# Patient Record
Sex: Male | Born: 1990 | Race: Black or African American | Hispanic: No | Marital: Married | Smoking: Never smoker
Health system: Southern US, Community
[De-identification: ages and names within clinical notes are randomized; demographics above are authoritative.]

## PROBLEM LIST (undated history)

## (undated) DIAGNOSIS — G43909 Migraine, unspecified, not intractable, without status migrainosus: Secondary | ICD-10-CM

## (undated) DIAGNOSIS — Z8619 Personal history of other infectious and parasitic diseases: Secondary | ICD-10-CM

## (undated) HISTORY — DX: Personal history of other infectious and parasitic diseases: Z86.19

## (undated) HISTORY — DX: Migraine, unspecified, not intractable, without status migrainosus: G43.909

---

## 2016-09-29 DIAGNOSIS — Z0000 Encounter for general adult medical examination without abnormal findings: Secondary | ICD-10-CM | POA: Diagnosis not present

## 2016-09-29 DIAGNOSIS — Z118 Encounter for screening for other infectious and parasitic diseases: Secondary | ICD-10-CM | POA: Diagnosis not present

## 2016-09-29 DIAGNOSIS — Z114 Encounter for screening for human immunodeficiency virus [HIV]: Secondary | ICD-10-CM | POA: Diagnosis not present

## 2016-09-29 DIAGNOSIS — F320 Major depressive disorder, single episode, mild: Secondary | ICD-10-CM | POA: Diagnosis not present

## 2016-10-07 DIAGNOSIS — Z0100 Encounter for examination of eyes and vision without abnormal findings: Secondary | ICD-10-CM | POA: Diagnosis not present

## 2017-03-25 DIAGNOSIS — J019 Acute sinusitis, unspecified: Secondary | ICD-10-CM | POA: Diagnosis not present

## 2017-04-11 DIAGNOSIS — J069 Acute upper respiratory infection, unspecified: Secondary | ICD-10-CM | POA: Diagnosis not present

## 2017-04-11 DIAGNOSIS — R509 Fever, unspecified: Secondary | ICD-10-CM | POA: Diagnosis not present

## 2017-04-16 ENCOUNTER — Encounter: Payer: Self-pay | Admitting: Physician Assistant

## 2017-04-16 ENCOUNTER — Emergency Department (HOSPITAL_COMMUNITY)
Admission: EM | Admit: 2017-04-16 | Discharge: 2017-04-16 | Disposition: A | Discharge status: Home / Self Care | Payer: BLUE CROSS/BLUE SHIELD | Attending: Emergency Medicine | Admitting: Emergency Medicine

## 2017-04-16 ENCOUNTER — Emergency Department (HOSPITAL_COMMUNITY): Payer: BLUE CROSS/BLUE SHIELD

## 2017-04-16 ENCOUNTER — Encounter (HOSPITAL_COMMUNITY): Payer: Self-pay | Admitting: Emergency Medicine

## 2017-04-16 ENCOUNTER — Ambulatory Visit (INDEPENDENT_AMBULATORY_CARE_PROVIDER_SITE_OTHER): Payer: BLUE CROSS/BLUE SHIELD | Admitting: Physician Assistant

## 2017-04-16 VITALS — BP 130/80 | HR 60 | Temp 97.9°F | Ht 67.0 in | Wt 215.5 lb

## 2017-04-16 DIAGNOSIS — R112 Nausea with vomiting, unspecified: Secondary | ICD-10-CM

## 2017-04-16 DIAGNOSIS — R1031 Right lower quadrant pain: Secondary | ICD-10-CM

## 2017-04-16 DIAGNOSIS — R509 Fever, unspecified: Secondary | ICD-10-CM

## 2017-04-16 DIAGNOSIS — R109 Unspecified abdominal pain: Secondary | ICD-10-CM | POA: Diagnosis not present

## 2017-04-16 LAB — COMPREHENSIVE METABOLIC PANEL
ALT: 20 U/L (ref 17–63)
AST: 22 U/L (ref 15–41)
Albumin: 4.4 g/dL (ref 3.5–5.0)
Alkaline Phosphatase: 58 U/L (ref 38–126)
Anion gap: 5 (ref 5–15)
BUN: 12 mg/dL (ref 6–20)
CO2: 27 mmol/L (ref 22–32)
Calcium: 9.1 mg/dL (ref 8.9–10.3)
Chloride: 107 mmol/L (ref 101–111)
Creatinine, Ser: 1.16 mg/dL (ref 0.61–1.24)
GFR calc Af Amer: >60 mL/min (ref >60)
GFR calc non Af Amer: >60 mL/min (ref >60)
Glucose, Bld: 100 mg/dL — ABNORMAL HIGH (ref 65–99)
Potassium: 4.1 mmol/L (ref 3.5–5.1)
Sodium: 139 mmol/L (ref 135–145)
Total Bilirubin: 0.8 mg/dL (ref 0.3–1.2)
Total Protein: 8.2 g/dL — ABNORMAL HIGH (ref 6.5–8.1)

## 2017-04-16 LAB — ABO/RH: ABO/RH(D): B POS

## 2017-04-16 LAB — CBC
HCT: 45.4 % (ref 39.0–52.0)
Hemoglobin: 15.4 g/dL (ref 13.0–17.0)
MCH: 29.1 pg (ref 26.0–34.0)
MCHC: 33.9 g/dL (ref 30.0–36.0)
MCV: 85.8 fL (ref 78.0–100.0)
Platelets: 199 10*3/uL (ref 150–400)
RBC: 5.29 MIL/uL (ref 4.22–5.81)
RDW: 13.1 % (ref 11.5–15.5)
WBC: 4.4 10*3/uL (ref 4.0–10.5)

## 2017-04-16 LAB — TYPE AND SCREEN
ABO/RH(D): B POS
Antibody Screen: NEGATIVE

## 2017-04-16 MED ORDER — IOPAMIDOL (ISOVUE-300) INJECTION 61%
100.0000 mL | Freq: Once | INTRAVENOUS | Status: AC | PRN
  Administered 2017-04-16: 100 mL via INTRAVENOUS

## 2017-04-16 NOTE — Progress Notes (Signed)
Ransom Nickson is a 26 y.o. male here for to [place], Dx with Flu over the weekend at Urgent Care. Still having Abd pain and nausea.  I acted as a Education administrator for [employer], PA-C Anselmo Pickler, LPN  History of Present Illness:   Chief Complaint  Patient presents with  . Nausea    Dx with Flu on Sat at [facility]  . Abdominal Pain  . 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 The first time, he presented with fever and sore throat, he thinks that time he was prescribed an antibiotic, but he is not sure. The other two times he presented with abdominal pain, nausea and fevers, and was prescribed Tamiflu both times. He states that was never tested for the flu or had any imaging or blood work done. His fevers have been as high as 103. He has had nausea and abdominal pain consistently all week. He finished his most recent Tamiflu course yesterday. Yesterday and today was the first time that he had any appetite and he ate some vegetables and rice. He has lost about 5 lb this month from being ill. Denies diarrhea. Does have occasional hard stools and at least weekly has blood on the toilet paper when he wipes. Denies blood in emesis. 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 Denies drugs or any alcohol intake. Last physical was around August 2017, states that he had some routine lab work done, but it is not available for me to review presently. Sexually active with women, monogamous. Denies any issues with urination, penile pain, lesions or discharge. Denies sick contacts or knowledge of anyone in his contacts with similar symptoms. At the beginning of the month, when he was having  sore throat he was taking OTC Emergen-C, Theraflu; has not taken any other OTC medications. States that he has been drinking water. States that his abdominal pain has been relatively constant since Saturday, has no relationship to food intake. Nothing makes the pain better or worse. He also reports that he has had significant fatigue.   Weight -- Weight: 215 lb 8 oz (97.8 kg)   Depression screen PHQ 2/9 04/16/2017  Decreased Interest 0  Down, Depressed, Hopeless 0  PHQ - 2 Score 0   PMHx, SurgHx, SocialHx, Medications, and Allergies were reviewed in the Visit Navigator and updated as appropriate.  Current Medications:  No current outpatient prescriptions on file.   Review of Systems:   Review of Systems  Constitutional: Positive for chills, fever and malaise/fatigue.  HENT: Positive for tinnitus.   Eyes: Negative.   Respiratory: Negative.   Cardiovascular: Negative.   Gastrointestinal: Positive for abdominal pain and nausea.  Genitourinary: Negative.   Musculoskeletal: Negative.   Skin: Negative.   Neurological: Positive for headaches.  Endo/Heme/Allergies: Bruises/bleeds easily.  Psychiatric/Behavioral: Negative.     Vitals:   Vitals:   04/16/17 1554  BP: 130/80  Pulse: 60  Temp: 97.9 F (36.6 C)  TempSrc: Oral  SpO2: 97%  Weight: 215 lb 8 oz (97.8 kg)  Height: 5\' 7"  (1.702 m)     Body mass index is 33.75 kg/m.  Physical Exam:  Physical Exam  Constitutional: He appears well-developed. He is cooperative.  Non-toxic appearance. He does not have a sickly appearance. He does not appear ill. No distress.  Cardiovascular: Normal rate, regular rhythm, S1 normal, S2 normal, normal heart sounds and normal pulses.   No LE edema  Pulmonary/Chest: Effort normal and breath sounds normal.  Abdominal: Normal appearance and bowel sounds are normal. There is tenderness in the right lower quadrant. There is guarding and tenderness at McBurney's point. There is no rigidity, no  rebound, no CVA tenderness and negative Murphy's sign.  Genitourinary: Rectal exam shows guaiac positive stool. Rectal exam shows no external hemorrhoid, no internal hemorrhoid, no fissure, no tenderness and anal tone normal.  Neurological: He is alert.  Nursing note and vitals reviewed.     Assessment and Plan:    Ed was seen today for nausea, abdominal pain and fatigue.  Diagnoses and all orders for this visit:  Right lower quadrant abdominal pain  Nausea and vomiting, intractability of vomiting not specified, unspecified vomiting type  Fever and chills   Given physical exam findings (RLQ pain, + McBurney's point, +guaic stool) and history, will send patient to ER for stat labs and work-up, especially to r/o appendicitis. I discussed with patient risks and benefits of going to ER vs in-house testing. He is agreeable to going to the ER. I advised patient that we will review his records and follow-up with him as needed. Patient's uncle transported him to office and will take him to the ER. I recommended [employer].  . Reviewed expectations re: course of current medical issues. . Discussed self-management of symptoms. . Outlined signs and symptoms indicating need for more acute intervention. . Patient verbalized understanding and all questions were answered. . See orders for this visit as documented in the electronic medical record. . Patient received an After-Visit Summary.  CMA or LPN served as scribe during this visit. History, Physical, and Plan performed by medical provider. Documentation and orders reviewed and attested to.  Inda Coke, PA-C

## 2017-04-16 NOTE — Patient Instructions (Signed)
Please go to [facility] ER for further evaluation.  Tell them you are having right-lower quadrant pain, fevers, and your stool was heme occult positive at the doctor's office today.  Address: [address], [place], [place] [address]

## 2017-04-16 NOTE — Progress Notes (Signed)
Pre visit review using our clinic review tool, if applicable. No additional management support is needed unless otherwise documented below in the visit note. 

## 2017-04-16 NOTE — ED Provider Notes (Signed)
Charles Brock   CSN: [id number] Arrival date & time: 04/16/17  1652  By signing my name below, I, Charles Brock, attest that this documentation has been prepared under the direction and in the presence of Charles Manifold, MD . Electronically Signed: Dolores Brock, Scribe. 04/16/2017. 7:26 PM.  History   Chief Complaint Chief Complaint  Patient presents with  . Rectal Bleeding   The history is provided by the patient. No language interpreter was used.    HPI Comments:  Charles Brock is a 26 y.o. male with no pertinent pmhx who presents to the Emergency Department complaining of intermittent, mild RLQ abdominal pain onset 5 days ago. He reports associated nausea, dizziness, rash, dark urine, headache and fatigue. Pt describes his pain as typically lasting for 2-3 minutes and during these episodes his nausea is much worse. Pt was seen by [facility] 5 days ago and was dx with the flu. He states that at the time he was experiencing sore throat and fevers but these symptoms have since resolved. Pt was seen earlier today at [facility] and his hemoccult resulted positive. Pt denies any chest pain or increased burping. He has no abdominal shx. No recent travel history. Pt is from [place] and came to the [place] 8 months ago.    Past Medical History:  Diagnosis Date  . History of chicken pox   . Migraines     There are no active problems to display for this patient.   History reviewed. No pertinent surgical history.     Home Medications    Prior to Admission medications   Not on File    Family History History reviewed. No pertinent family history.  Social History Social History  Substance Use Topics  . Smoking status: Never Smoker  . Smokeless tobacco: Never Used  . Alcohol use No     Allergies   Patient has no known allergies.   Review of Systems Review of Systems  Constitutional: Positive for fatigue.  HENT:       Negative for Burping  Cardiovascular: Negative for  chest pain.  Gastrointestinal: Positive for nausea.  Genitourinary:       Positive for Dark Urine  Skin: Positive for rash.  Neurological: Positive for dizziness and headaches.  All other systems reviewed and are negative.    Physical Exam Updated Vital Signs BP (!) 126/93 (BP Location: Left Arm)   Pulse 65   Temp 98.2 F (36.8 C) (Oral)   Resp 20   SpO2 100%   Physical Exam  Constitutional: He is oriented to person, place, and time. He appears well-developed and well-nourished.  HENT:  Head: Normocephalic and atraumatic.  Eyes: EOM are normal.  Neck: Normal range of motion.  Cardiovascular: Normal rate, regular rhythm, normal heart sounds and intact distal pulses.   Pulmonary/Chest: Effort normal and breath sounds normal. No respiratory distress.  Abdominal: Soft. He exhibits no distension. There is no tenderness.  Right sided abdominal tenderness worse in RLQ. Voluntary guarding.   Musculoskeletal: Normal range of motion.  Neurological: He is alert and oriented to person, place, and time.  Skin: Skin is warm and dry.  3 subcutaneous nodules about the size of a dime to right forearm, right lower thoracic back, and one left-mid thigh. Mildly tender. No overlying skin changes.   Psychiatric: He has a normal mood and affect. Judgment normal.  Nursing Brock and vitals reviewed.    ED Treatments / Results  DIAGNOSTIC STUDIES:  Oxygen Saturation is 100% on  RA, normal by my interpretation.    COORDINATION OF CARE:  7:39 PM Discussed treatment plan with pt at bedside which includes blood work and pt agreed to plan.  Labs (all labs ordered are listed, but only abnormal results are displayed) Labs Reviewed  COMPREHENSIVE METABOLIC PANEL - Abnormal; Notable for the following:       Result Value   Glucose, Bld 100 (*)    Total Protein 8.2 (*)    All other components within normal limits  CBC  TYPE AND SCREEN  ABO/RH    EKG  EKG Interpretation None        Radiology No results found.   Ct Abdomen Pelvis W Contrast  Result Date: 04/16/2017 CLINICAL DATA:  Initial evaluation for intermittent mild right lower quadrant abdominal pain. EXAM: CT ABDOMEN AND PELVIS WITH CONTRAST TECHNIQUE: Multidetector CT imaging of the abdomen and pelvis was performed using the standard protocol following bolus administration of intravenous contrast. CONTRAST:  111mL ISOVUE-300 IOPAMIDOL (ISOVUE-300) INJECTION 61% COMPARISON:  None available. FINDINGS: Lower chest: Visualized lung bases are clear. Hepatobiliary: Liver within normal limits. Gallbladder normal. No biliary dilatation. Pancreas: Pancreas within normal limits. Spleen: Spleen within normal limits. Adrenals/Urinary Tract: Adrenal glands are normal. Kidneys equal in size with symmetric enhancement. No nephrolithiasis, hydronephrosis, or focal enhancing renal mass. No hydroureter. Partially distended bladder within normal limits. Stomach/Bowel: Stomach within normal limits. No evidence for bowel obstruction. Appendix well visualized within the right lower quadrant, and is of normal caliber and appearance without associated inflammatory changes to suggest acute appendicitis. No other acute inflammatory changes seen elsewhere about the bowels. Vascular/Lymphatic: Normal intravascular enhancement seen throughout the intra-abdominal aorta and its branch vessels. No adenopathy. Reproductive: Prostate normal. Other: No free air or fluid. Small fat containing paraumbilical hernia noted. No right inguinal hernia. Musculoskeletal: No acute osseus abnormality. No worrisome lytic or blastic osseous lesions. Mild levoscoliosis. IMPRESSION: 1. No CT evidence for acute intra-abdominal or pelvic process. 2. Normal appendix. 3. No other findings to explain patient's symptoms identified. Electronically Signed   By: Charles Brock M.D.   On: 04/16/2017 21:01    Procedures Procedures (including critical care time)  Medications  Ordered in ED Medications - No data to display   Initial Impression / Assessment and Plan / ED Course  I have reviewed the triage vital signs and the nursing notes.  Pertinent labs & imaging results that were available during my care of the patient were reviewed by me and considered in my medical decision making (see chart for details).     25yM with R abdominal pain. No hernia. UA ok. CT w/o acute abnormality.   Final Clinical Impressions(s) / ED Diagnoses   Final diagnoses:  Right lower quadrant abdominal pain    New Prescriptions New Prescriptions   No medications on file    I personally preformed the services scribed in my presence. The recorded information has been reviewed is accurate. Charles Manifold, MD.     Charles Manifold, MD 04/22/17 0930

## 2017-04-16 NOTE — ED Triage Notes (Signed)
Pt reports headache, RLQ abdominal pain, nausea and generalized body aches onset last Saturday, went to [facility], diagnosed with influenza, diagnosed with flu and medicated. Abdominal pain and headache since resolved. Fecal occult positive today at Urgent Care. Intermittent dizziness since Saturday.

## 2017-04-16 NOTE — ED Notes (Signed)
Patient transported to CT 

## 2017-06-15 DIAGNOSIS — H00029 Hordeolum internum unspecified eye, unspecified eyelid: Secondary | ICD-10-CM | POA: Diagnosis not present

## 2017-06-23 DIAGNOSIS — F439 Reaction to severe stress, unspecified: Secondary | ICD-10-CM | POA: Diagnosis not present

## 2017-08-08 IMAGING — CT CT ABD-PELV W/ CM
2 of 4 series · 16 of 46 positions shown, 18 images · IV contrast (ISOVUE)
Comparison: None available.

CLINICAL DATA: Initial evaluation for intermittent mild right lower
quadrant abdominal pain.

EXAM:
CT ABDOMEN AND PELVIS WITH CONTRAST
TECHNIQUE: Multidetector CT imaging of the abdomen and pelvis was performed
using the standard protocol following bolus administration of
intravenous contrast.
CONTRAST:  100mL [id number] IOPAMIDOL ([id number]) INJECTION 61%

[Series 2: abd/pel with · axial · 0.79mm/px · z∈[+1008,+1428]mm · 13 of 96 slices shown, 15 images]
[im 6/96  soft-tissue]
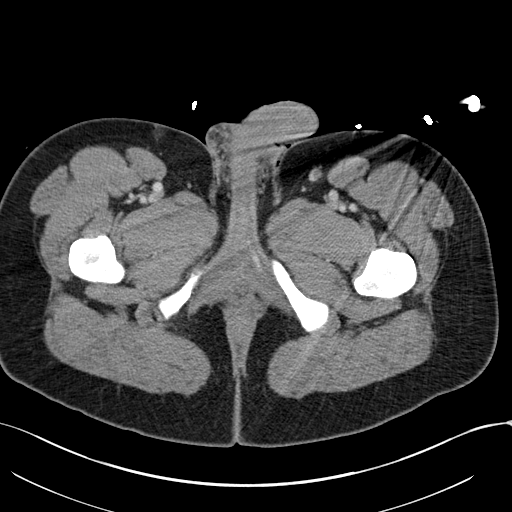
[im 6/96  bone]
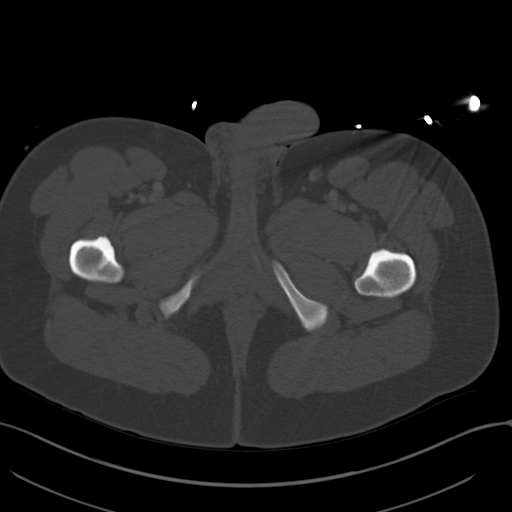
[im 11/96  soft-tissue]
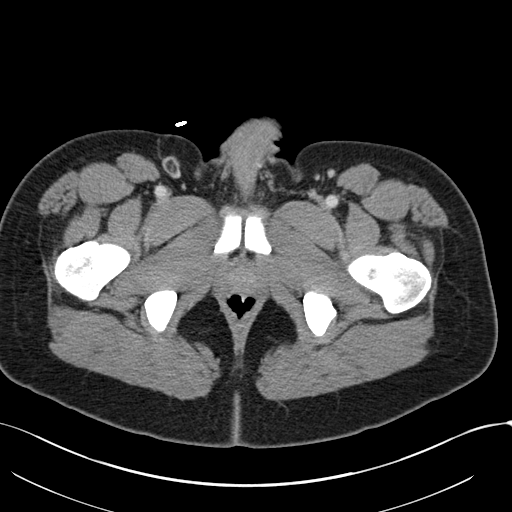
[im 22/96  soft-tissue]
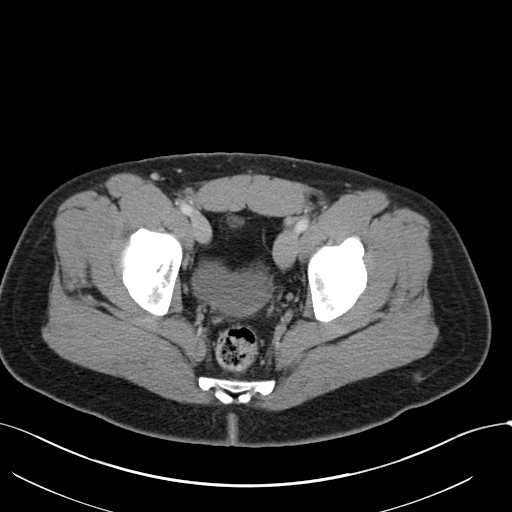
[im 27/96  soft-tissue]
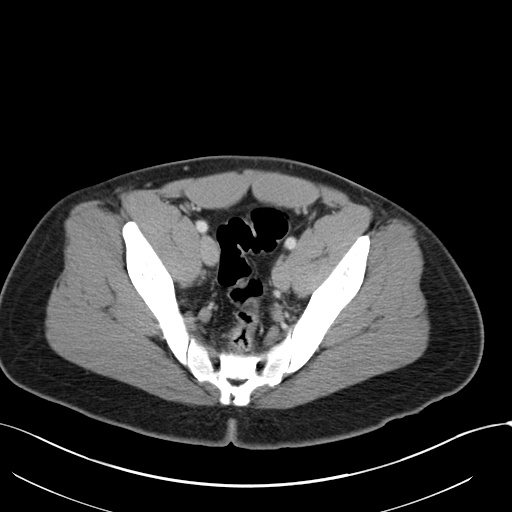
[im 32/96  soft-tissue]
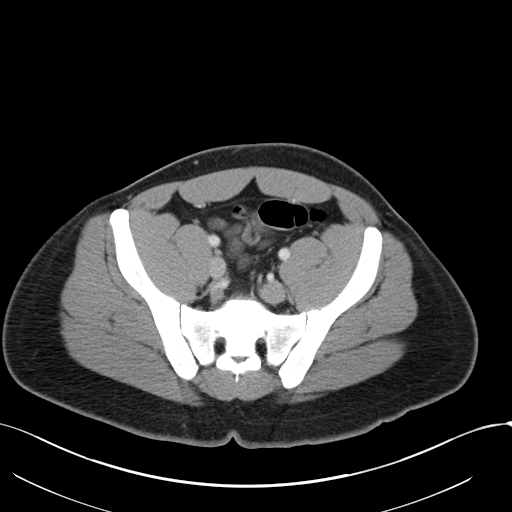
[im 43/96  soft-tissue]
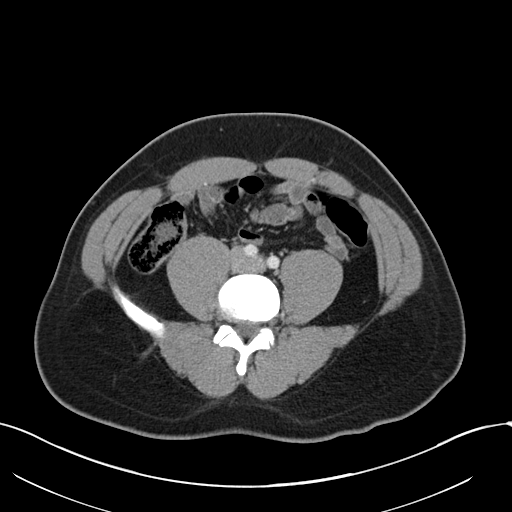
[im 48/96  soft-tissue]
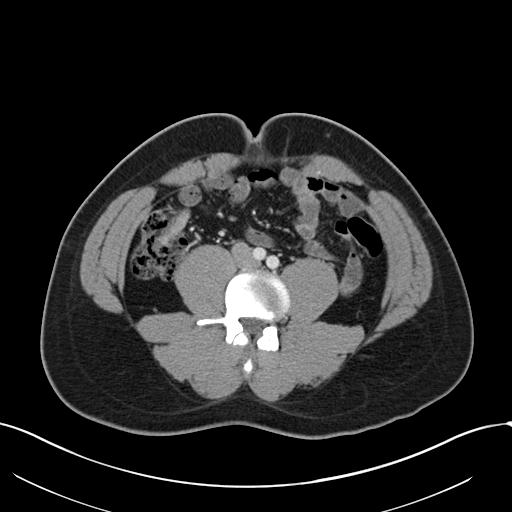
[im 53/96  soft-tissue]
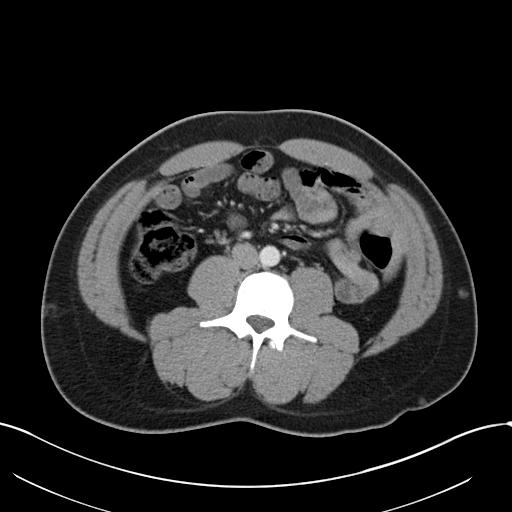
[im 64/96  soft-tissue]
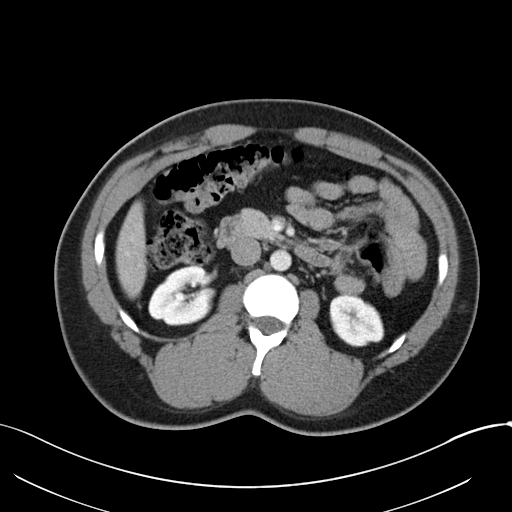
[im 64/96  bone]
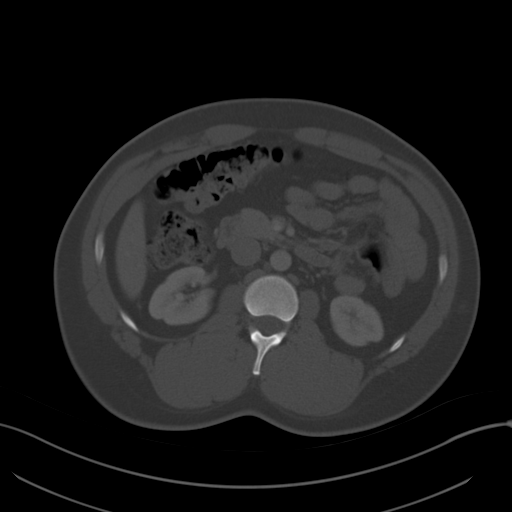
[im 69/96  soft-tissue]
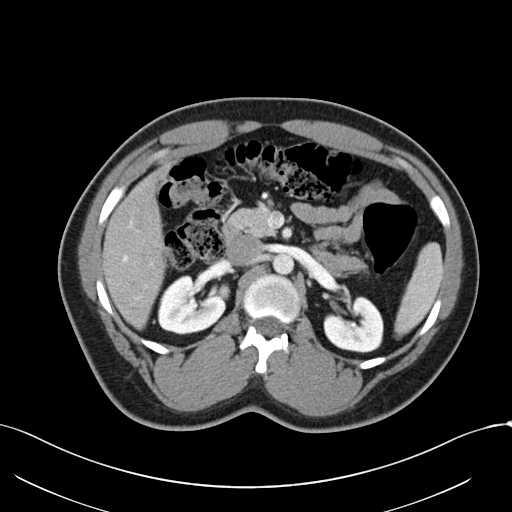
[im 74/96  soft-tissue]
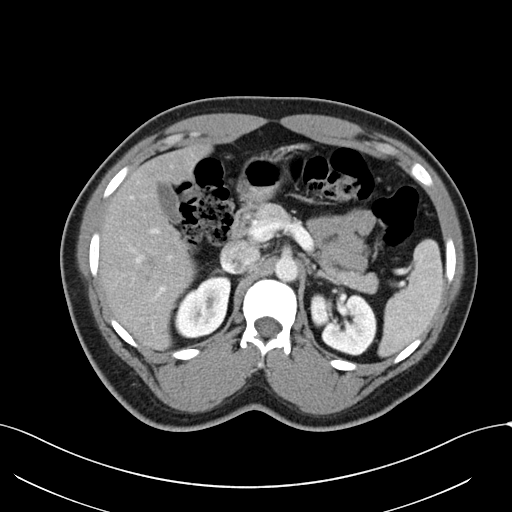
[im 85/96  soft-tissue]
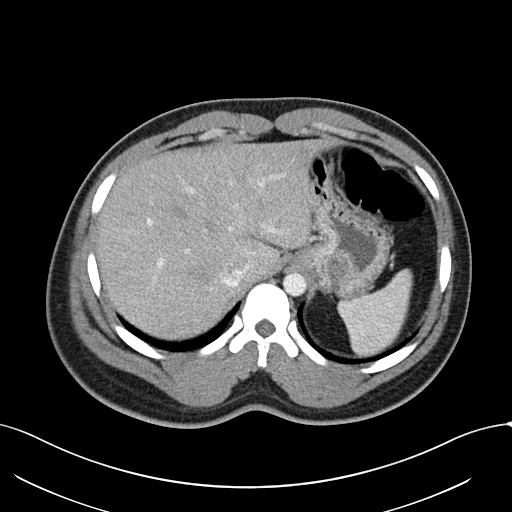
[im 90/96  soft-tissue]
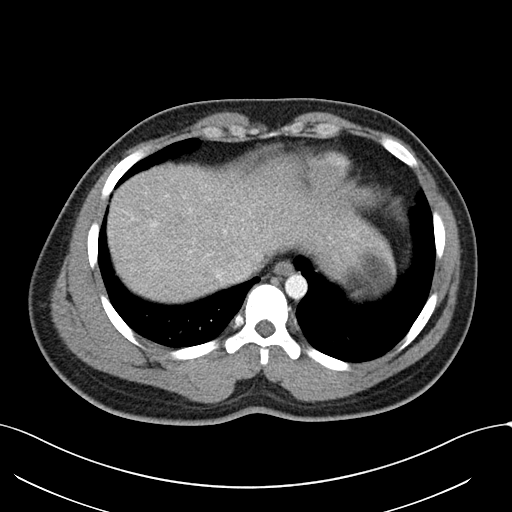

[Series 5: coronal a/|p · coronal · 0.74mm/px · 3 of 137 slices shown]
[im 46/137  soft-tissue]
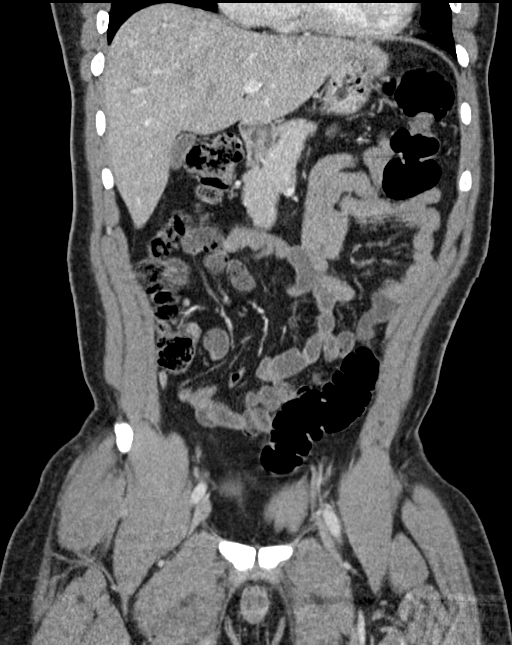
[im 61/137  soft-tissue]
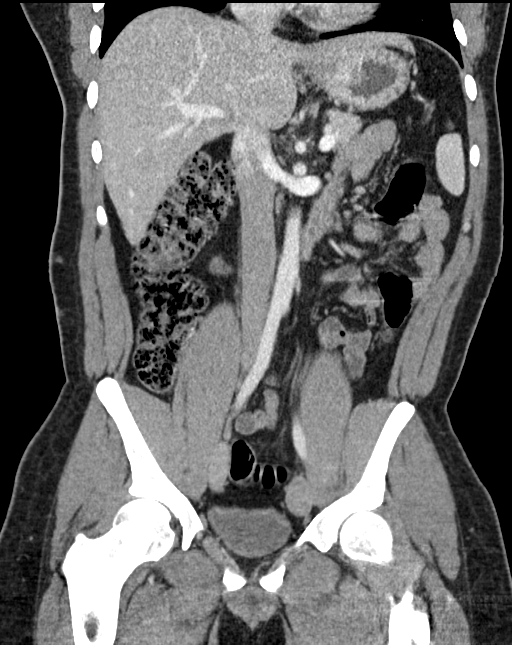
[im 76/137  soft-tissue]
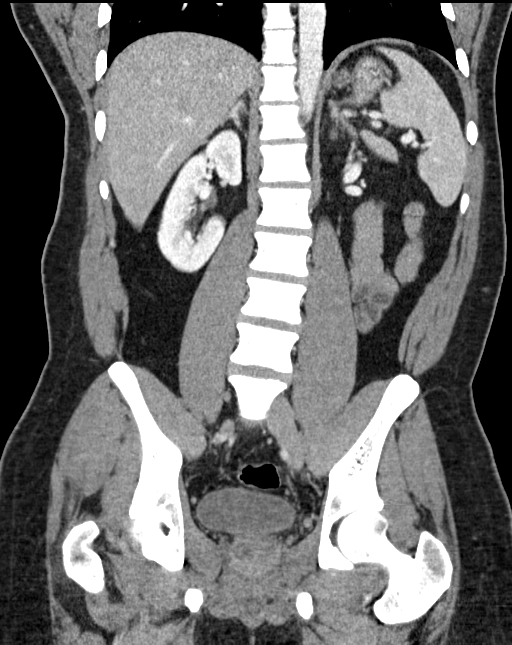

[16 of 46 positions shown; findings below may reference images not displayed]

FINDINGS: Lower chest: Visualized lung bases are clear.

Hepatobiliary: Liver within normal limits. Gallbladder normal. No
biliary dilatation.

Pancreas: Pancreas within normal limits.

Spleen: Spleen within normal limits.

Adrenals/Urinary Tract: Adrenal glands are normal. Kidneys equal in
size with symmetric enhancement. No nephrolithiasis, hydronephrosis,
or focal enhancing renal mass. No hydroureter. Partially distended
bladder within normal limits.

Stomach/Bowel: Stomach within normal limits. No evidence for bowel
obstruction. Appendix well visualized within the right lower
quadrant, and is of normal caliber and appearance without associated
inflammatory changes to suggest acute appendicitis. No other acute
inflammatory changes seen elsewhere about the bowels.

Vascular/Lymphatic: Normal intravascular enhancement seen throughout
the intra-abdominal aorta and its branch vessels. No adenopathy.

Reproductive: Prostate normal.

Other: No free air or fluid. Small fat containing paraumbilical
hernia noted. No right inguinal hernia.

Musculoskeletal: No acute osseus abnormality. No worrisome lytic or
blastic osseous lesions. Mild levoscoliosis.
IMPRESSION: 1. No CT evidence for acute intra-abdominal or pelvic process.
2. Normal appendix.
3. No other findings to explain patient's symptoms identified.

## 2017-12-02 DIAGNOSIS — Z0000 Encounter for general adult medical examination without abnormal findings: Secondary | ICD-10-CM | POA: Diagnosis not present

## 2017-12-02 DIAGNOSIS — Z113 Encounter for screening for infections with a predominantly sexual mode of transmission: Secondary | ICD-10-CM | POA: Diagnosis not present

## 2018-03-01 DIAGNOSIS — F439 Reaction to severe stress, unspecified: Secondary | ICD-10-CM | POA: Diagnosis not present

## 2018-03-09 ENCOUNTER — Telehealth: Payer: Self-pay | Admitting: *Deleted

## 2018-03-09 NOTE — Telephone Encounter (Signed)
Spoke to pt asked him if he had his Flu shot? Pt sad no did not get it. Told pt he is due for a physical and if planning to come back can call back and schedule. Pt verbalized understanding.

## 2018-05-18 DIAGNOSIS — Z23 Encounter for immunization: Secondary | ICD-10-CM | POA: Diagnosis not present

## 2018-08-04 ENCOUNTER — Ambulatory Visit (INDEPENDENT_AMBULATORY_CARE_PROVIDER_SITE_OTHER): Payer: Self-pay | Admitting: Physician Assistant

## 2018-08-04 ENCOUNTER — Encounter: Payer: Self-pay | Admitting: Physician Assistant

## 2018-08-04 ENCOUNTER — Ambulatory Visit: Payer: BLUE CROSS/BLUE SHIELD | Admitting: Physician Assistant

## 2018-08-04 DIAGNOSIS — M546 Pain in thoracic spine: Secondary | ICD-10-CM

## 2018-08-04 MED ORDER — CYCLOBENZAPRINE HCL 5 MG PO TABS: 5.0000 mg | ORAL_TABLET | Freq: Three times a day (TID) | ORAL | 0 refills | Status: DC | PRN

## 2018-08-04 NOTE — Patient Instructions (Signed)
It was great to see you!  Start Flexeril, may take at night to help with muscle tension.   Let's follow-up if symptoms worsen or persist despite treatment.  Take care,  Inda Coke PA-C
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
## 2019-10-07 ENCOUNTER — Other Ambulatory Visit: Payer: Self-pay

## 2019-10-07 ENCOUNTER — Encounter: Payer: Self-pay | Admitting: Physician Assistant

## 2019-10-07 ENCOUNTER — Ambulatory Visit (INDEPENDENT_AMBULATORY_CARE_PROVIDER_SITE_OTHER): Payer: BC Managed Care – PPO | Admitting: Physician Assistant

## 2019-10-07 VITALS — BP 130/84 | HR 68 | Temp 97.8°F | Ht 67.0 in | Wt 244.0 lb

## 2019-10-07 DIAGNOSIS — Z7689 Persons encountering health services in other specified circumstances: Secondary | ICD-10-CM | POA: Diagnosis not present

## 2019-10-07 DIAGNOSIS — Z136 Encounter for screening for cardiovascular disorders: Secondary | ICD-10-CM

## 2019-10-07 DIAGNOSIS — Z13220 Encounter for screening for lipoid disorders: Secondary | ICD-10-CM | POA: Diagnosis not present

## 2019-10-07 DIAGNOSIS — H5310 Unspecified subjective visual disturbances: Secondary | ICD-10-CM | POA: Diagnosis not present

## 2019-10-07 DIAGNOSIS — Z23 Encounter for immunization: Secondary | ICD-10-CM

## 2019-10-07 DIAGNOSIS — Z0001 Encounter for general adult medical examination with abnormal findings: Secondary | ICD-10-CM | POA: Diagnosis not present

## 2019-10-07 DIAGNOSIS — R0683 Snoring: Secondary | ICD-10-CM

## 2019-10-07 DIAGNOSIS — E669 Obesity, unspecified: Secondary | ICD-10-CM | POA: Diagnosis not present

## 2019-10-07 LAB — COMPREHENSIVE METABOLIC PANEL
ALT: 25 U/L (ref 0–53)
AST: 19 U/L (ref 0–37)
Albumin: 4.4 g/dL (ref 3.5–5.2)
Alkaline Phosphatase: 70 U/L (ref 39–117)
BUN: 11 mg/dL (ref 6–23)
CO2: 26 mEq/L (ref 19–32)
Calcium: 9.4 mg/dL (ref 8.4–10.5)
Chloride: 105 mEq/L (ref 96–112)
Creatinine, Ser: 1.27 mg/dL (ref 0.40–1.50)
GFR: 80.61 mL/min (ref >60.00)
Glucose, Bld: 98 mg/dL (ref 70–99)
Potassium: 4.4 mEq/L (ref 3.5–5.1)
Sodium: 139 mEq/L (ref 135–145)
Total Bilirubin: 0.9 mg/dL (ref 0.2–1.2)
Total Protein: 7.3 g/dL (ref 6.0–8.3)

## 2019-10-07 LAB — LIPID PANEL
Cholesterol: 93 mg/dL (ref 0–200)
HDL: 30.4 mg/dL — ABNORMAL LOW (ref >39.00)
LDL Cholesterol: 52 mg/dL (ref 0–99)
NonHDL: 62.88
Total CHOL/HDL Ratio: 3
Triglycerides: 53 mg/dL (ref 0.0–149.0)
VLDL: 10.6 mg/dL (ref 0.0–40.0)

## 2019-10-07 LAB — CBC WITH DIFFERENTIAL/PLATELET
Basophils Absolute: 0.1 10*3/uL (ref 0.0–0.1)
Basophils Relative: 1.1 % (ref 0.0–3.0)
Eosinophils Absolute: 0.3 10*3/uL (ref 0.0–0.7)
Eosinophils Relative: 6.1 % — ABNORMAL HIGH (ref 0.0–5.0)
HCT: 44.8 % (ref 39.0–52.0)
Hemoglobin: 15 g/dL (ref 13.0–17.0)
Lymphocytes Relative: 47.9 % — ABNORMAL HIGH (ref 12.0–46.0)
Lymphs Abs: 2.4 10*3/uL (ref 0.7–4.0)
MCHC: 33.5 g/dL (ref 30.0–36.0)
MCV: 86.2 fl (ref 78.0–100.0)
Monocytes Absolute: 0.4 10*3/uL (ref 0.1–1.0)
Monocytes Relative: 7.1 % (ref 3.0–12.0)
Neutro Abs: 1.9 10*3/uL (ref 1.4–7.7)
Neutrophils Relative %: 37.8 % — ABNORMAL LOW (ref 43.0–77.0)
Platelets: 218 10*3/uL (ref 150.0–400.0)
RBC: 5.2 Mil/uL (ref 4.22–5.81)
RDW: 13.4 % (ref 11.5–15.5)
WBC: 5.1 10*3/uL (ref 4.0–10.5)

## 2019-10-07 LAB — HEMOGLOBIN A1C: Hgb A1c MFr Bld: 5.8 % (ref 4.6–6.5)

## 2019-10-07 NOTE — Patient Instructions (Signed)
It was great to see you!  *You will be contacted about your sleep study referral. *Please consider purchasing "Blue Light Blocking Glasses" to help with your eye strain.  Please go to the lab for blood work.   Our office will call you with your results unless you have chosen to receive results via MyChart.  If your blood work is normal we will follow-up each year for physicals and as scheduled for chronic medical problems.  If anything is abnormal we will treat accordingly and get you in for a follow-up.  Take care,  [facility] Maintenance, Male Adopting a healthy lifestyle and getting preventive care are important in promoting health and wellness. Ask your health care provider about:  The right schedule for you to have regular tests and exams.  Things you can do on your own to prevent diseases and keep yourself healthy. What should I know about diet, weight, and exercise? Eat a healthy diet   Eat a diet that includes plenty of vegetables, fruits, low-fat dairy products, and lean protein.  Do not eat a lot of foods that are high in solid fats, added sugars, or sodium. Maintain a healthy weight Body mass index (BMI) is a measurement that can be used to identify possible weight problems. It estimates body fat based on height and weight. Your health care provider can help determine your BMI and help you achieve or maintain a healthy weight. Get regular exercise Get regular exercise. This is one of the most important things you can do for your health. Most adults should:  Exercise for at least 150 minutes each week. The exercise should increase your heart rate and make you sweat (moderate-intensity exercise).  Do strengthening exercises at least twice a week. This is in addition to the moderate-intensity exercise.  Spend less time sitting. Even light physical activity can be beneficial. Watch cholesterol and blood lipids Have your blood tested for lipids and cholesterol  at 30 years of age, then have this test every 5 years. You may need to have your cholesterol levels checked more often if:  Your lipid or cholesterol levels are high.  You are older than 30 years of age.  You are at high risk for heart disease. What should I know about cancer screening? Many types of cancers can be detected early and may often be prevented. Depending on your health history and family history, you may need to have cancer screening at various ages. This may include screening for:  Colorectal cancer.  Prostate cancer.  Skin cancer.  Lung cancer. What should I know about heart disease, diabetes, and high blood pressure? Blood pressure and heart disease  High blood pressure causes heart disease and increases the risk of stroke. This is more likely to develop in people who have high blood pressure readings, are of African descent, or are overweight.  Talk with your health care provider about your target blood pressure readings.  Have your blood pressure checked: ? Every 3-5 years if you are 61-21 years of age. ? Every year if you are 46 years old or older.  If you are between the ages of 76 and 69 and are a current or former smoker, ask your health care provider if you should have a one-time screening for abdominal aortic aneurysm (AAA). Diabetes Have regular diabetes screenings. This checks your fasting blood sugar level. Have the screening done:  Once every three years after age 26 if you are at a normal weight and  have a low risk for diabetes.  More often and at a younger age if you are overweight or have a high risk for diabetes. What should I know about preventing infection? Hepatitis B If you have a higher risk for hepatitis B, you should be screened for this virus. Talk with your health care provider to find out if you are at risk for hepatitis B infection. Hepatitis C Blood testing is recommended for:  Everyone born from [birth year] through 1965.  Anyone with  known risk factors for hepatitis C. Sexually transmitted infections (STIs)  You should be screened each year for STIs, including gonorrhea and chlamydia, if: ? You are sexually active and are younger than 30 years of age. ? You are older than 30 years of age and your health care provider tells you that you are at risk for this type of infection. ? Your sexual activity has changed since you were last screened, and you are at increased risk for chlamydia or gonorrhea. Ask your health care provider if you are at risk.  Ask your health care provider about whether you are at high risk for HIV. Your health care provider may recommend a prescription medicine to help prevent HIV infection. If you choose to take medicine to prevent HIV, you should first get tested for HIV. You should then be tested every 3 months for as long as you are taking the medicine. Follow these instructions at home: Lifestyle  Do not use any products that contain nicotine or tobacco, such as cigarettes, e-cigarettes, and chewing tobacco. If you need help quitting, ask your health care provider.  Do not use street drugs.  Do not share needles.  Ask your health care provider for help if you need support or information about quitting drugs. Alcohol use  Do not drink alcohol if your health care provider tells you not to drink.  If you drink alcohol: ? Limit how much you have to 0-2 drinks a day. ? Be aware of how much alcohol is in your drink. In the [place] one drink equals one 12 oz bottle of beer (355 mL), one 5 oz glass of wine (148 mL), or one 1 oz glass of hard liquor (44 mL). General instructions  Schedule regular health, dental, and eye exams.  Stay current with your vaccines.  Tell your health care provider if: ? You often feel depressed. ? You have ever been abused or do not feel safe at home. Summary  Adopting a healthy lifestyle and getting preventive care are important in promoting health and wellness.   Follow your health care provider's instructions about healthy diet, exercising, and getting tested or screened for diseases.  Follow your health care provider's instructions on monitoring your cholesterol and blood pressure. This information is not intended to replace advice given to you by your health care provider. Make sure you discuss any questions you have with your health care provider. Document Released: 06/05/2008 Document Revised: 12/01/2018 Document Reviewed: 12/01/2018 Elsevier Patient Education  2020 [employer].

## 2019-10-07 NOTE — Progress Notes (Signed)
Subjective:    Charles Brock is a 28 y.o. male and is here for a comprehensive physical exam.  HPI  Health Maintenance Due  Topic Date Due  . HIV Screening  11/01/2004  . INFLUENZA VACCINE  07/23/2019    Acute Concerns: Trouble sleeping -- patient reports that he has noticed that about 2 times a night he is waking up in the middle the night.  He states that he wakes up feeling startled.  He does snore at night, and he states that when he wakes up he feels like it was not due to a dream.  He states that his cousin is actually a sleep therapist and stated that he may be having some sleep apnea episodes.  Patient is interested in a sleep study.  He does get very limited amount of sleep due to hectic work schedule.  On average he gets about 5 to 6 hours. Headaches -- patient works about 10 to 12 hours a day, and states that over 95% of his time he is staring at a computer.  He is getting left eye strain with this.  He went to an eye doctor and they said that he did not have any need for corrective vision.  He tries to take breaks from work but he does not always get to.  Chronic Issues: Weight gain/obesity --patient reports that he is having ongoing issues with weight gain.  He suspects that it is due to his lifestyle.  He often goes all day without eating, sits at a computer, and then will overeat when he gets home.  He has tried to start drinking more water at work.  He even tried to purchase shake that he can drink during the day, but he forgot to.  He does have family history of diabetes and hypertension.  He states that he is currently has highest weight ever.  He recently started doing CrossFit twice a week.  Health Maintenance: Immunizations --received flu shot today Diet --often goes all day without eating, then will eat large meal at night, denies a lot of fast food Caffeine intake --none Sleep habits --see above Exercise --CrossFit 2 times a week Weight -- Weight: 244 lb (110.7 kg)   Weight history Wt Readings from Last 10 Encounters:  10/07/19 244 lb (110.7 kg)  08/04/18 236 lb (107 kg)  04/16/17 215 lb 8 oz (97.8 kg)  Body mass index is 38.22 kg/m. Mood --situational anxiety with work Tobacco use --none Alcohol use --- none  Depression screen PHQ 2/9 10/07/2019  Decreased Interest 0  Down, Depressed, Hopeless 0  PHQ - 2 Score 0     Other providers/specialists: Patient Care Team: Charles Brock, [place] as PCP - General (Physician Assistant)   PMHx, SurgHx, SocialHx, Medications, and Allergies were reviewed in the Visit Navigator and updated as appropriate.   Past Medical History:  Diagnosis Date  . History of chicken pox   . Migraines     History reviewed. No pertinent surgical history.   Family History  Problem Relation Age of Onset  . Hypertension Mother   . Diabetes Mother   . Hypertension Father   . Diabetes Paternal Grandmother   . Diabetes Paternal Grandfather     Social History   Tobacco Use  . Smoking status: Never Smoker  . Smokeless tobacco: Never Used  Substance Use Topics  . Alcohol use: No  . Drug use: No    Review of Systems:   Review of Systems  Constitutional: Negative  for chills, fever, malaise/fatigue and weight loss.  HENT: Negative for hearing loss, sinus pain and sore throat.   Eyes: Positive for pain. Negative for blurred vision and double vision.  Respiratory: Negative for cough and hemoptysis.   Cardiovascular: Negative for chest pain, palpitations, leg swelling and PND.  Gastrointestinal: Negative for abdominal pain, constipation, diarrhea, heartburn, nausea and vomiting.  Genitourinary: Negative for dysuria, frequency and urgency.  Musculoskeletal: Negative for back pain, myalgias and neck pain.  Skin: Negative for itching and rash.  Neurological: Negative for dizziness, tingling, seizures and headaches.  Endo/Heme/Allergies: Negative for polydipsia.  Psychiatric/Behavioral: Negative for depression. The  patient is not nervous/anxious.     Objective:   Vitals:   10/07/19 0828  BP: 130/84  Pulse: 68  Temp: 97.8 F (36.6 C)  SpO2: 96%   Body mass index is 38.22 kg/m.  General Appearance:  Alert, cooperative, no distress, appears stated age  Head:  Normocephalic, without obvious abnormality, atraumatic  Eyes:  PERRL, conjunctiva/corneas clear, EOM's intact, fundi benign, both eyes       Ears:  Normal TM's and external ear canals, both ears  Nose: Nares normal, septum midline, mucosa normal, no drainage    or sinus tenderness  Throat: Lips, mucosa, and tongue normal; teeth and gums normal  Neck: Supple, symmetrical, trachea midline, no adenopathy; thyroid:  No enlargement/tenderness/nodules; no carotit bruit or JVD  Back:   Symmetric, no curvature, ROM normal, no CVA tenderness  Lungs:   Clear to auscultation bilaterally, respirations unlabored  Chest wall:  No tenderness or deformity  Heart:  Regular rate and rhythm, S1 and S2 normal, no murmur, rub   or gallop  Abdomen:   Soft, non-tender, bowel sounds active all four quadrants, no masses, no organomegaly  Extremities: Extremities normal, atraumatic, no cyanosis or edema  Prostate: Not done.   Skin: Skin color, texture, turgor normal, no rashes or lesions  Lymph nodes: Cervical, supraclavicular, and axillary nodes normal  Neurologic: CNII-XII grossly intact. Normal strength, sensation and reflexes throughout    Assessment/Plan:   Charles Brock was seen today for annual exam.  Diagnoses and all orders for this visit:  Encounter for general adult medical examination with abnormal findings Today patient counseled on age appropriate routine health concerns for screening and prevention, each reviewed and up to date or declined. Immunizations reviewed and up to date or declined. Labs ordered and reviewed. Risk factors for depression reviewed and negative. Hearing function and visual acuity are intact. ADLs screened and addressed as needed.  Functional ability and level of safety reviewed and appropriate. Education, counseling and referrals performed based on assessed risks today. Patient provided with a copy of personalized plan for preventive services.  Encounter for lipid screening for cardiovascular disease -     Lipid panel  Obesity, unspecified classification, unspecified obesity type, unspecified whether serious comorbidity present Discussed need for continued CrossFit, and need to start doing regular intake of food throughout the day.  We discussed maybe finding some shakes or other grabbing those snacks for him to consume while at work.  Will check labs to check for insulin resistance and hyperlipidemia. -     CBC with Differential/Platelet -     Comprehensive metabolic panel -     Hemoglobin A1c  Snoring; Sleep concern I am concerned that he might be having some apneic spells.  Will refer to neurology.  Eye strain, left Discussed possibly trialing Blue Light Blocker glasses.   Well Adult Exam: Labs ordered: Yes. Patient counseling  was done. See below for items discussed. Discussed the patient's BMI.  The BMI is not in the acceptable range; BMI management plan is completed Follow up as needed for acute illness.  Patient Counseling: [x]   Nutrition: Stressed importance of moderation in sodium/caffeine intake, saturated fat and cholesterol, caloric balance, sufficient intake of fresh fruits, vegetables, and fiber.  [x]   Stressed the importance of regular exercise.   []   Substance Abuse: Discussed cessation/primary prevention of tobacco, alcohol, or other drug use; driving or other dangerous activities under the influence; availability of treatment for abuse.   [x]   Injury prevention: Discussed safety belts, safety helmets, smoke detector, smoking near bedding or upholstery.   []   Sexuality: Discussed sexually transmitted diseases, partner selection, use of condoms, avoidance of unintended pregnancy  and contraceptive  alternatives.   [x]   Dental health: Discussed importance of regular tooth brushing, flossing, and dental visits.  [x]   Health maintenance and immunizations reviewed. Please refer to Health maintenance section.    Charles Coke, PA-C [place]

## 2019-10-11 ENCOUNTER — Encounter: Payer: Self-pay | Admitting: *Deleted

## 2020-10-19 ENCOUNTER — Encounter: Payer: Self-pay | Admitting: Physician Assistant

## 2020-10-19 ENCOUNTER — Other Ambulatory Visit: Payer: Self-pay

## 2020-10-19 ENCOUNTER — Ambulatory Visit (INDEPENDENT_AMBULATORY_CARE_PROVIDER_SITE_OTHER): Payer: BC Managed Care – PPO | Admitting: Physician Assistant

## 2020-10-19 VITALS — BP 110/80 | HR 63 | Temp 97.6°F | Ht 67.0 in | Wt 223.0 lb

## 2020-10-19 DIAGNOSIS — Z23 Encounter for immunization: Secondary | ICD-10-CM | POA: Diagnosis not present

## 2020-10-19 DIAGNOSIS — F321 Major depressive disorder, single episode, moderate: Secondary | ICD-10-CM | POA: Diagnosis not present

## 2020-10-19 DIAGNOSIS — Z1159 Encounter for screening for other viral diseases: Secondary | ICD-10-CM

## 2020-10-19 DIAGNOSIS — Z13220 Encounter for screening for lipoid disorders: Secondary | ICD-10-CM

## 2020-10-19 DIAGNOSIS — Z114 Encounter for screening for human immunodeficiency virus [HIV]: Secondary | ICD-10-CM

## 2020-10-19 DIAGNOSIS — Z0000 Encounter for general adult medical examination without abnormal findings: Secondary | ICD-10-CM | POA: Diagnosis not present

## 2020-10-19 DIAGNOSIS — E669 Obesity, unspecified: Secondary | ICD-10-CM

## 2020-10-19 DIAGNOSIS — Z136 Encounter for screening for cardiovascular disorders: Secondary | ICD-10-CM

## 2020-10-19 LAB — CBC WITH DIFFERENTIAL/PLATELET
Absolute Monocytes: 313 cells/uL (ref 200–950)
Basophils Absolute: 41 cells/uL (ref 0–200)
Hemoglobin: 16.1 g/dL (ref 13.2–17.1)
MCH: 28.8 pg (ref 27.0–33.0)
Neutro Abs: 1555 cells/uL (ref 1500–7800)

## 2020-10-19 NOTE — Patient Instructions (Signed)
It was great to see you!  You will get a call soon to schedule a visit with the therapist.  If you are feeling suicidal, please tell someone you trust. Go to [address], [place]. This is a 24/7 behavioral health urgent care center.  Please follow-up in me in 2-4 weeks for your mood, sooner if concerns.  Please go to the lab for blood work.   Our office will call you with your results unless you have chosen to receive results via MyChart.  If your blood work is normal we will follow-up each year for physicals and as scheduled for chronic medical problems.  If anything is abnormal we will treat accordingly and get you in for a follow-up.  Take care,  [facility] Maintenance, Male Adopting a healthy lifestyle and getting preventive care are important in promoting health and wellness. Ask your health care provider about:  The right schedule for you to have regular tests and exams.  Things you can do on your own to prevent diseases and keep yourself healthy. What should I know about diet, weight, and exercise? Eat a healthy diet   Eat a diet that includes plenty of vegetables, fruits, low-fat dairy products, and lean protein.  Do not eat a lot of foods that are high in solid fats, added sugars, or sodium. Maintain a healthy weight Body mass index (BMI) is a measurement that can be used to identify possible weight problems. It estimates body fat based on height and weight. Your health care provider can help determine your BMI and help you achieve or maintain a healthy weight. Get regular exercise Get regular exercise. This is one of the most important things you can do for your health. Most adults should:  Exercise for at least 150 minutes each week. The exercise should increase your heart rate and make you sweat (moderate-intensity exercise).  Do strengthening exercises at least twice a week. This is in addition to the moderate-intensity exercise.  Spend less  time sitting. Even light physical activity can be beneficial. Watch cholesterol and blood lipids Have your blood tested for lipids and cholesterol at 31 years of age, then have this test every 5 years. You may need to have your cholesterol levels checked more often if:  Your lipid or cholesterol levels are high.  You are older than 31 years of age.  You are at high risk for heart disease. What should I know about cancer screening? Many types of cancers can be detected early and may often be prevented. Depending on your health history and family history, you may need to have cancer screening at various ages. This may include screening for:  Colorectal cancer.  Prostate cancer.  Skin cancer.  Lung cancer. What should I know about heart disease, diabetes, and high blood pressure? Blood pressure and heart disease  High blood pressure causes heart disease and increases the risk of stroke. This is more likely to develop in people who have high blood pressure readings, are of African descent, or are overweight.  Talk with your health care provider about your target blood pressure readings.  Have your blood pressure checked: ? Every 3-5 years if you are 78-61 years of age. ? Every year if you are 77 years old or older.  If you are between the ages of 53 and 36 and are a current or former smoker, ask your health care provider if you should have a one-time screening for abdominal aortic aneurysm (AAA). Diabetes  Have regular diabetes screenings. This checks your fasting blood sugar level. Have the screening done:  Once every three years after age 45 if you are at a normal weight and have a low risk for diabetes.  More often and at a younger age if you are overweight or have a high risk for diabetes. What should I know about preventing infection? Hepatitis B If you have a higher risk for hepatitis B, you should be screened for this virus. Talk with your health care provider to find out if  you are at risk for hepatitis B infection. Hepatitis C Blood testing is recommended for:  Everyone born from [birth year] through 1967.  Anyone with known risk factors for hepatitis C. Sexually transmitted infections (STIs)  You should be screened each year for STIs, including gonorrhea and chlamydia, if: ? You are sexually active and are younger than 31 years of age. ? You are older than 31 years of age and your health care provider tells you that you are at risk for this type of infection. ? Your sexual activity has changed since you were last screened, and you are at increased risk for chlamydia or gonorrhea. Ask your health care provider if you are at risk.  Ask your health care provider about whether you are at high risk for HIV. Your health care provider may recommend a prescription medicine to help prevent HIV infection. If you choose to take medicine to prevent HIV, you should first get tested for HIV. You should then be tested every 3 months for as long as you are taking the medicine. Follow these instructions at home: Lifestyle  Do not use any products that contain nicotine or tobacco, such as cigarettes, e-cigarettes, and chewing tobacco. If you need help quitting, ask your health care provider.  Do not use street drugs.  Do not share needles.  Ask your health care provider for help if you need support or information about quitting drugs. Alcohol use  Do not drink alcohol if your health care provider tells you not to drink.  If you drink alcohol: ? Limit how much you have to 0-2 drinks a day. ? Be aware of how much alcohol is in your drink. In the [place] one drink equals one 12 oz bottle of beer (355 mL), one 5 oz glass of wine (148 mL), or one 1 oz glass of hard liquor (44 mL). General instructions  Schedule regular health, dental, and eye exams.  Stay current with your vaccines.  Tell your health care provider if: ? You often feel depressed. ? You have ever been abused or  do not feel safe at home. Summary  Adopting a healthy lifestyle and getting preventive care are important in promoting health and wellness.  Follow your health care provider's instructions about healthy diet, exercising, and getting tested or screened for diseases.  Follow your health care provider's instructions on monitoring your cholesterol and blood pressure. This information is not intended to replace advice given to you by your health care provider. Make sure you discuss any questions you have with your health care provider. Document Revised: 12/01/2018 Document Reviewed: 12/01/2018 Elsevier Patient Education  2020 [employer].

## 2020-10-19 NOTE — Progress Notes (Signed)
I acted as a Education administrator for [employer], PA-C Charles Pickler, LPN   Subjective:    Charles Brock is a 29 y.o. male and is here for a comprehensive physical exam.  HPI  Health Maintenance Due  Topic Date Due  . Hepatitis C Screening  Never done  . INFLUENZA VACCINE  07/22/2020   Acute Concerns: None  Chronic Issues: Depression -- recent increase in depression. Dealt with this in 2017 but saw a therapist and had improvement in symptoms. Currently struggling with depressive symptoms related to work and his wife. Has issues with not feeling listened to at work. Denies SI/HI.  Health Maintenance: Immunizations -- UTD,will give Flu vaccine Colonoscopy -- N/A PSA -- N/A Diet -- working on eating heathy food choices Sleep habits -- sometimes gets limited hours, denies issues with sleeping Exercise -- regular exercise Weight -- Weight: 223 lb (101.2 kg) ; got down to 209 lb in February Weight history Wt Readings from Last 10 Encounters:  10/19/20 223 lb (101.2 kg)  10/07/19 244 lb (110.7 kg)  08/04/18 236 lb (107 kg)  04/16/17 215 lb 8 oz (97.8 kg)   Body mass index is 34.93 kg/m. Mood -- see above Tobacco use --    Tobacco Use: Low Risk   . Smoking Tobacco Use: Never Smoker  . Smokeless Tobacco Use: Never Used    Alcohol use ---  reports no history of alcohol use.   Depression screen PHQ 2/9 10/19/2020  Decreased Interest 3  Down, Depressed, Hopeless 3  PHQ - 2 Score 6  Altered sleeping 2  Tired, decreased energy 3  Change in appetite 3  Feeling bad or failure about yourself  2  Trouble concentrating 3  Moving slowly or fidgety/restless 2  Suicidal thoughts 1  PHQ-9 Score 22  Difficult doing work/chores Very difficult     Other providers/specialists: Patient Care Team: Charles Brock, [place] as PCP - General (Physician Assistant)   PMHx, SurgHx, SocialHx, Medications, and Allergies were reviewed in the Visit Navigator and updated as appropriate.   Past  Medical History:  Diagnosis Date  . History of chicken pox   . Migraines     No past surgical history on file.   Family History  Problem Relation Age of Onset  . Hypertension Mother   . Diabetes Mother   . Hypertension Father   . Diabetes Paternal Grandmother   . Diabetes Paternal Grandfather     Social History   Tobacco Use  . Smoking status: Never Smoker  . Smokeless tobacco: Never Used  Substance Use Topics  . Alcohol use: No  . Drug use: No    Review of Systems:   Review of Systems  Constitutional: Negative.  Negative for chills, fever, malaise/fatigue and weight loss.  HENT: Negative.  Negative for hearing loss, sinus pain and sore throat.   Eyes: Negative.  Negative for blurred vision.  Respiratory: Negative.  Negative for cough and shortness of breath.   Cardiovascular: Negative.  Negative for chest pain, palpitations and leg swelling.  Gastrointestinal: Negative.  Negative for abdominal pain, constipation, diarrhea, heartburn, nausea and vomiting.  Genitourinary: Negative.  Negative for dysuria, frequency and urgency.  Musculoskeletal: Negative.  Negative for back pain, myalgias and neck pain.  Skin: Negative.  Negative for itching and rash.  Neurological: Negative.  Negative for dizziness, tingling, seizures, loss of consciousness and headaches.  Endo/Heme/Allergies: Negative.  Negative for polydipsia.  Psychiatric/Behavioral: Positive for depression. The patient is nervous/anxious.     Objective:  Vitals:   10/19/20 0836  BP: 110/80  Pulse: 63  Temp: 97.6 F (36.4 C)  SpO2: 96%   Body mass index is 34.93 kg/m.  General Appearance:  Alert, cooperative, no distress, appears stated age  Head:  Normocephalic, without obvious abnormality, atraumatic  Eyes:  PERRL, conjunctiva/corneas clear, EOM's intact, fundi benign, both eyes       Ears:  Normal TM's and external ear canals, both ears  Nose: Nares normal, septum midline, mucosa normal, no drainage     or sinus tenderness  Throat: Lips, mucosa, and tongue normal; teeth and gums normal  Neck: Supple, symmetrical, trachea midline, no adenopathy; thyroid:  No enlargement/tenderness/nodules; no carotit bruit or JVD  Back:   Symmetric, no curvature, ROM normal, no CVA tenderness  Lungs:   Clear to auscultation bilaterally, respirations unlabored  Chest wall:  No tenderness or deformity  Heart:  Regular rate and rhythm, S1 and S2 normal, no murmur, rub   or gallop  Abdomen:   Soft, non-tender, bowel sounds active all four quadrants, no masses, no organomegaly  Extremities: Extremities normal, atraumatic, no cyanosis or edema  Prostate: Not done.   Skin: Skin color, texture, turgor normal, no rashes or lesions  Lymph nodes: Cervical, supraclavicular, and axillary nodes normal  Neurologic: CNII-XII grossly intact. Normal strength, sensation and reflexes throughout    Assessment/Plan:   [name] was seen today for annual exam.  Diagnoses and all orders for this visit:  Routine physical examination Today patient counseled on age appropriate routine health concerns for screening and prevention, each reviewed and up to date or declined. Immunizations reviewed and up to date or declined. Labs ordered and reviewed. Risk factors for depression reviewed and negative. Hearing function and visual acuity are intact. ADLs screened and addressed as needed. Functional ability and level of safety reviewed and appropriate. Education, counseling and referrals performed based on assessed risks today. Patient provided with a copy of personalized plan for preventive services.  Depression, major, single episode, moderate ([place]) Denies SI/HI in office. Declines medication. Start talk therapy -- referral placed. Recommend follow-up with me q 2-4 weeks until therapy begins. I discussed with patient that if they develop any SI, to tell someone immediately and seek medical attention. Information for behavioral health  UC given. -     CBC with Differential/Platelet; Future -     Comprehensive metabolic panel; Future -     Ambulatory referral to Psychology  Obesity, unspecified classification, unspecified obesity type, unspecified whether serious comorbidity present Continue to work on healthy diet and exercise.  Encounter for lipid screening for cardiovascular disease -     Lipid panel; Future  Screening for HIV (human immunodeficiency virus) -     HIV Antibody (routine testing w rflx); Future  Encounter for screening for other viral diseases -     Hepatitis C antibody; Future  Well Adult Exam: Labs ordered: Yes. Patient counseling was done. See below for items discussed. Discussed the patient's BMI.  The BMI is not in the acceptable range; BMI management plan is completed Follow up in 4 weeks.  Patient Counseling: [x]   Nutrition: Stressed importance of moderation in sodium/caffeine intake, saturated fat and cholesterol, caloric balance, sufficient intake of fresh fruits, vegetables, and fiber.  [x]   Stressed the importance of regular exercise.   []   Substance Abuse: Discussed cessation/primary prevention of tobacco, alcohol, or other drug use; driving or other dangerous activities under the influence; availability of treatment for abuse.   [x]   Injury prevention:  Discussed safety belts, safety helmets, smoke detector, smoking near bedding or upholstery.   []   Sexuality: Discussed sexually transmitted diseases, partner selection, use of condoms, avoidance of unintended pregnancy  and contraceptive alternatives.   [x]   Dental health: Discussed importance of regular tooth brushing, flossing, and dental visits.  [x]   Health maintenance and immunizations reviewed. Please refer to Health maintenance section.    CMA or LPN served as scribe during this visit. History, Physical, and Plan performed by medical provider. The above documentation has been reviewed and is accurate and complete.  Charles Coke,  PA-C [place]

## 2020-10-19 NOTE — Addendum Note (Signed)
Addended by: Cristopher Estimable on: 10/19/2020 09:28 AM   Modules accepted: Orders

## 2020-10-22 LAB — COMPREHENSIVE METABOLIC PANEL
AG Ratio: 1.4 (calc) (ref 1.0–2.5)
ALT: 16 U/L (ref 9–46)
AST: 16 U/L (ref 10–40)
Albumin: 4.6 g/dL (ref 3.6–5.1)
Alkaline phosphatase (APISO): 47 U/L (ref 36–130)
BUN: 16 mg/dL (ref 7–25)
CO2: 27 mmol/L (ref 20–32)
Calcium: 9.7 mg/dL (ref 8.6–10.3)
Chloride: 103 mmol/L (ref 98–110)
Creat: 1.3 mg/dL (ref 0.60–1.35)
Globulin: 3.2 g/dL (calc) (ref 1.9–3.7)
Glucose, Bld: 96 mg/dL (ref 65–99)
Potassium: 4.5 mmol/L (ref 3.5–5.3)
Sodium: 139 mmol/L (ref 135–146)
Total Bilirubin: 1.5 mg/dL — ABNORMAL HIGH (ref 0.2–1.2)
Total Protein: 7.8 g/dL (ref 6.1–8.1)

## 2020-10-22 LAB — CBC WITH DIFFERENTIAL/PLATELET
Basophils Relative: 0.9 %
Eosinophils Absolute: 322 cells/uL (ref 15–500)
Eosinophils Relative: 7 %
HCT: 48.7 % (ref 38.5–50.0)
Lymphs Abs: 2369 cells/uL (ref 850–3900)
MCHC: 33.1 g/dL (ref 32.0–36.0)
MCV: 87.1 fL (ref 80.0–100.0)
MPV: 10.3 fL (ref 7.5–12.5)
Monocytes Relative: 6.8 %
Neutrophils Relative %: 33.8 %
Platelets: 241 10*3/uL (ref 140–400)
RBC: 5.59 10*6/uL (ref 4.20–5.80)
RDW: 13.1 % (ref 11.0–15.0)
Total Lymphocyte: 51.5 %
WBC: 4.6 10*3/uL (ref 3.8–10.8)

## 2020-10-22 LAB — HIV ANTIBODY (ROUTINE TESTING W REFLEX): HIV 1&2 Ab, 4th Generation: NONREACTIVE

## 2020-10-22 LAB — HEPATITIS C ANTIBODY
Hepatitis C Ab: NONREACTIVE
SIGNAL TO CUT-OFF: 0.03 (ref <1.00)

## 2020-10-22 LAB — LIPID PANEL
Cholesterol: 123 mg/dL (ref <200)
HDL: 37 mg/dL — ABNORMAL LOW (ref >=40)
LDL Cholesterol (Calc): 72 mg/dL (calc)
Non-HDL Cholesterol (Calc): 86 mg/dL (calc) (ref <130)
Total CHOL/HDL Ratio: 3.3 (calc) (ref <5.0)
Triglycerides: 61 mg/dL (ref <150)

## 2020-11-02 ENCOUNTER — Ambulatory Visit (INDEPENDENT_AMBULATORY_CARE_PROVIDER_SITE_OTHER): Payer: BC Managed Care – PPO | Admitting: Psychologist

## 2020-11-02 DIAGNOSIS — Z630 Problems in relationship with spouse or partner: Secondary | ICD-10-CM

## 2020-11-02 DIAGNOSIS — F321 Major depressive disorder, single episode, moderate: Secondary | ICD-10-CM | POA: Diagnosis not present

## 2020-11-08 ENCOUNTER — Ambulatory Visit (INDEPENDENT_AMBULATORY_CARE_PROVIDER_SITE_OTHER): Payer: BC Managed Care – PPO | Admitting: Psychologist

## 2020-11-08 DIAGNOSIS — F321 Major depressive disorder, single episode, moderate: Secondary | ICD-10-CM | POA: Diagnosis not present

## 2020-11-23 ENCOUNTER — Ambulatory Visit (INDEPENDENT_AMBULATORY_CARE_PROVIDER_SITE_OTHER): Payer: BC Managed Care – PPO | Admitting: Psychologist

## 2020-11-23 DIAGNOSIS — F321 Major depressive disorder, single episode, moderate: Secondary | ICD-10-CM

## 2020-11-23 DIAGNOSIS — Z630 Problems in relationship with spouse or partner: Secondary | ICD-10-CM | POA: Diagnosis not present

## 2020-11-30 ENCOUNTER — Ambulatory Visit: Payer: BC Managed Care – PPO | Attending: Internal Medicine

## 2020-11-30 DIAGNOSIS — Z23 Encounter for immunization: Secondary | ICD-10-CM

## 2020-11-30 NOTE — Progress Notes (Signed)
   Covid-19 Vaccination Clinic  Name:  Charles Brock    MRN: [id number] DOB: 07/12/1991  11/30/2020  [name] was observed post Covid-19 immunization for 15 minutes without incident. He was provided with Vaccine Information Sheet and instruction to access the V-Safe system.   [name] was instructed to call 911 with any severe reactions post vaccine: Marland Kitchen Difficulty breathing  . Swelling of face and throat  . A fast heartbeat  . A bad rash all over body  . Dizziness and weakness   Immunizations Administered    Name Date Dose VIS Date Route   Pfizer COVID-19 Vaccine 11/30/2020  3:58 PM 0.3 mL 10/10/2020 Intramuscular   Manufacturer: [place]   Lot: [id number]   [place]: [id number]

## 2020-12-12 ENCOUNTER — Ambulatory Visit (INDEPENDENT_AMBULATORY_CARE_PROVIDER_SITE_OTHER): Payer: BC Managed Care – PPO | Admitting: Psychologist

## 2020-12-12 DIAGNOSIS — F321 Major depressive disorder, single episode, moderate: Secondary | ICD-10-CM | POA: Diagnosis not present

## 2020-12-12 DIAGNOSIS — Z630 Problems in relationship with spouse or partner: Secondary | ICD-10-CM

## 2020-12-24 ENCOUNTER — Ambulatory Visit (HOSPITAL_COMMUNITY)
Admission: EM | Admit: 2020-12-24 | Discharge: 2020-12-24 | Disposition: A | Discharge status: Home / Self Care | Payer: BC Managed Care – PPO | Attending: Family Medicine | Admitting: Family Medicine

## 2020-12-24 ENCOUNTER — Telehealth: Payer: Self-pay

## 2020-12-24 ENCOUNTER — Other Ambulatory Visit: Payer: Self-pay

## 2020-12-24 ENCOUNTER — Encounter (HOSPITAL_COMMUNITY): Payer: Self-pay | Admitting: Pediatrics

## 2020-12-24 ENCOUNTER — Emergency Department (HOSPITAL_COMMUNITY)
Admission: EM | Admit: 2020-12-24 | Discharge: 2020-12-25 | Disposition: A | Discharge status: Home / Self Care | Payer: BC Managed Care – PPO | Attending: Emergency Medicine | Admitting: Emergency Medicine

## 2020-12-24 ENCOUNTER — Emergency Department (HOSPITAL_COMMUNITY): Payer: BC Managed Care – PPO

## 2020-12-24 ENCOUNTER — Encounter (HOSPITAL_COMMUNITY): Payer: Self-pay | Admitting: Emergency Medicine

## 2020-12-24 DIAGNOSIS — R079 Chest pain, unspecified: Secondary | ICD-10-CM

## 2020-12-24 DIAGNOSIS — R0789 Other chest pain: Secondary | ICD-10-CM | POA: Insufficient documentation

## 2020-12-24 LAB — BASIC METABOLIC PANEL
Anion gap: 7 (ref 5–15)
BUN: 15 mg/dL (ref 6–20)
CO2: 27 mmol/L (ref 22–32)
Calcium: 9.1 mg/dL (ref 8.9–10.3)
Chloride: 104 mmol/L (ref 98–111)
Creatinine, Ser: 1.17 mg/dL (ref 0.61–1.24)
GFR, Estimated: >60 mL/min (ref >60)
Glucose, Bld: 88 mg/dL (ref 70–99)
Potassium: 4 mmol/L (ref 3.5–5.1)
Sodium: 138 mmol/L (ref 135–145)

## 2020-12-24 LAB — CBC
HCT: 47.9 % (ref 39.0–52.0)
Hemoglobin: 15.3 g/dL (ref 13.0–17.0)
MCH: 28.2 pg (ref 26.0–34.0)
MCHC: 31.9 g/dL (ref 30.0–36.0)
MCV: 88.4 fL (ref 80.0–100.0)
Platelets: 222 10*3/uL (ref 150–400)
RBC: 5.42 MIL/uL (ref 4.22–5.81)
RDW: 13 % (ref 11.5–15.5)
WBC: 8.9 10*3/uL (ref 4.0–10.5)
nRBC: 0 % (ref 0.0–0.2)

## 2020-12-24 LAB — TROPONIN I (HIGH SENSITIVITY)
Troponin I (High Sensitivity): 3 ng/L (ref <18)
Troponin I (High Sensitivity): 2 ng/L (ref <18)

## 2020-12-24 NOTE — Telephone Encounter (Signed)
Pt sent to ED

## 2020-12-24 NOTE — ED Triage Notes (Signed)
Pt states that he is having chest pain and HA which started at 5am this morning. Pt states that his chest pain is located on the right side. Pt states that he is SOB denies any dizziness. Pt states that EMS came this morning to check him out and did an EKG, there recommendation was to f/u at [facility]. Per EMS EKG was normal.

## 2020-12-24 NOTE — Telephone Encounter (Signed)
Nurse Assessment Nurse: Alexander Mt RN, Nicholaus Bloom Date/Time (Eastern Time): 12/24/2020 8:50:29 AM Confirm and document reason for call. If symptomatic, describe symptoms. ---Caller states he woke up at 0500 with severe chest pain. States the pain has lessened but still there. Pain is 6/10. states the pain is in the right chest and radiates to the left side. Breathing makes the pain worse. Does the patient have any new or worsening symptoms? ---Yes Will a triage be completed? ---Yes Related visit to physician within the last 2 weeks? ---No Does the PT have any chronic conditions? (i.e. diabetes, asthma, this includes High risk factors for pregnancy, etc.) ---No Is this a behavioral health or substance abuse call? ---No Guidelines Guideline Title Affirmed Question Affirmed Notes Nurse Date/Time Lamount Cohen Time) Chest Pain [1] Chest pain lasts > 5 minutes AND [2] described as crushing, pressure-like, or heavy Alexander Mt, RN, Nicholaus Bloom 12/24/2020 8:53:03 AM Disp. Time Lamount Cohen Time) Disposition Final User 12/24/2020 8:49:44 AM Send to Urgent Queue Namon Cirri 12/24/2020 9:03:01 AM 911 Outcome Documentation Alexander Mt, RN, Nicholaus Bloom Reason: unable to reach pt 12/24/2020 8:54:52 AM Call EMS 911 Now Yes [place], RN, Nicholaus Bloom PLEASE NOTE: All timestamps contained within this report are represented as [place] Standard Time. CONFIDENTIALTY NOTICE: This fax transmission is intended only for the addressee. It contains information that is legally privileged, confidential or otherwise protected from use or disclosure. If you are not the intended recipient, you are strictly prohibited from reviewing, disclosing, copying using or disseminating any of this information or taking any action in reliance on or regarding this information. If you have received this fax in error, please notify us immediately by telephone so that we can arrange for its return to [place]. Phone: [phone], Toll-Free: [phone], Fax: [phone] Page: 2 of  2 Call Id: [id number] Caller Disagree/Comply Comply Caller Understands Yes PreDisposition Go to ED Care Advice Given Per Guideline CALL EMS 911 NOW: * Immediate medical attention is needed. You need to hang up and call 911 (or an ambulance). * Triager Discretion: I'll call you back in a few minutes to be sure you were able to reach them. CARE ADVICE given per Chest Pain (Adult) guideline. Referrals GO TO FACILITY UNDECIDE

## 2020-12-24 NOTE — ED Triage Notes (Signed)
Patient c/o chest pain started around 5 am today, and was seen at [facility], advice to come to ED. Pt c/o headache and dry mouth as well. Denies PMH.

## 2020-12-25 LAB — D-DIMER, QUANTITATIVE: D-Dimer, Quant: <0.27 ug/mL-FEU (ref 0.00–0.50)

## 2020-12-25 NOTE — Discharge Instructions (Addendum)
Your work-up in the ED was reassuring.  We recommend follow-up with your primary care doctor for recheck.  Take ibuprofen 600mg  every 6 hours for management of persistent pain.  Return for new or concerning symptoms.

## 2020-12-25 NOTE — ED Notes (Signed)
Patient verbalized understanding of discharge instructions. Opportunity for questions and answers.  

## 2020-12-25 NOTE — ED Provider Notes (Signed)
[facility] EMERGENCY DEPARTMENT Provider Note   CSN: [id number] Arrival date & time: 12/24/20  1517     History Chief Complaint  Patient presents with  . Chest Pain    Charles Brock is a 30 y.o. male.  30 year old male with no significant past medical history presents to the emergency department for evaluation of chest pain.  He reports sharp, right-sided chest pain which has been constant since 5 AM.  It is waxing and waning in severity.  Reports worsening of his pain when leaning forward.  It is slightly improved when supine.  Feels that breathing does aggravate his pain as well.  Denies fever, cough, syncope, abdominal pain, vomiting, diarrhea, hemoptysis, leg swelling.  No recent surgeries or hospitalizations.  No personal or family history of DVT/PE.  Denies prior cardiac history.       Past Medical History:  Diagnosis Date  . History of chicken pox   . Migraines     There are no problems to display for this patient.   History reviewed. No pertinent surgical history.     Family History  Problem Relation Age of Onset  . Hypertension Mother   . Diabetes Mother   . Hypertension Father   . Diabetes Paternal Grandmother   . Diabetes Paternal Grandfather     Social History   Tobacco Use  . Smoking status: Never Smoker  . Smokeless tobacco: Never Used  Substance Use Topics  . Alcohol use: No  . Drug use: No    Home Medications Prior to Admission medications   Not on File    Allergies    Patient has no known allergies.  Review of Systems   Review of Systems  Ten systems reviewed and are negative for acute change, except as noted in the HPI.    Physical Exam Updated Vital Signs BP (!) 129/97   Pulse 67   Temp 98.6 F (37 C)   Resp 18   Ht 5\' 10"  (1.778 m)   Wt 100.7 kg   SpO2 98%   BMI 31.85 kg/m   Physical Exam Vitals and nursing note reviewed.  Constitutional:      General: He is not in acute distress.    Appearance:  He is well-developed and well-nourished. He is not diaphoretic.     Comments: Nontoxic appearing and in NAD  HENT:     Head: Normocephalic and atraumatic.  Eyes:     General: No scleral icterus.    Extraocular Movements: EOM normal.     Conjunctiva/sclera: Conjunctivae normal.  Cardiovascular:     Rate and Rhythm: Normal rate and regular rhythm.     Pulses: Normal pulses.  Pulmonary:     Effort: Pulmonary effort is normal. No respiratory distress.     Breath sounds: No stridor. No wheezing or rales.     Comments: Lungs CTAB. Respirations even and unlabored. No reproducible chest wall TTP. Musculoskeletal:        General: Normal range of motion.     Cervical back: Normal range of motion.     Comments: No BLE edema.  Skin:    General: Skin is warm and dry.     Coloration: Skin is not pale.     Findings: No erythema or rash.  Neurological:     Mental Status: He is alert and oriented to person, place, and time.     Coordination: Coordination normal.  Psychiatric:        Mood and Affect: Mood and  affect normal.        Behavior: Behavior normal.     ED Results / Procedures / Treatments   Labs (all labs ordered are listed, but only abnormal results are displayed) Labs Reviewed  BASIC METABOLIC PANEL  CBC  D-DIMER, QUANTITATIVE (NOT AT [facility])  TROPONIN I (HIGH SENSITIVITY)  TROPONIN I (HIGH SENSITIVITY)    EKG EKG Interpretation  Date/Time:  Monday December 24 2020 15:44:15 EST Ventricular Rate:  59 PR Interval:  184 QRS Duration: 86 QT Interval:  366 QTC Calculation: 362 R Axis:   68 Text Interpretation: Sinus bradycardia Diffuse ST elevation, repolarization changes Consider pericarditis Abnormal ECG NO change from prior Confirmed by Thayer Jew [id number]) on 12/25/2020 3:36:16 AM   Radiology DG Chest 2 View  Result Date: 12/24/2020 CLINICAL DATA:  Chest pain. EXAM: CHEST - 2 VIEW COMPARISON:  None. FINDINGS: The heart size and mediastinal contours are within normal  limits. Both lungs are clear. The visualized skeletal structures are unremarkable. IMPRESSION: No active cardiopulmonary disease. Electronically Signed   By: Margaretha Sheffield MD   On: 12/24/2020 16:08    Procedures Procedures (including critical care time)  Medications Ordered in ED Medications - No data to display  ED Course  I have reviewed the triage vital signs and the nursing notes.  Pertinent labs & imaging results that were available during my care of the patient were reviewed by me and considered in my medical decision making (see chart for details).  Clinical Course as of 12/25/20 0404  Tue Dec 25, 2020  0235 Patient with R sided chest pain onset 5AM yesterday. Constant and waxing/waning. While EKG suggests pericarditis, may be early repolarization pattern. States that pain is improved when lying supine and aggravated leaning forward and sitting up which is not typically characteristic of pericarditis. Troponin negative x 2. No reproducible TTP to chest wall. D dimer added to assess for PE; low pretest probability. Patient declines medication for pain. [KH]    Clinical Course User Index [KH] Antonietta Breach, PA-C   MDM Rules/Calculators/A&P                          Patient presents to the emergency department for evaluation of chest pain.  Onset at 5AM yesterday.  Has remained fairly constant.  EKG is nonischemic and c/w early repolarization pattern.  Troponin negative x 2.  Chest x-ray without evidence of mediastinal widening to suggest dissection.  No pneumothorax, pneumonia, pleural effusion.  Pulmonary embolus further considered; however, patient without tachycardia, tachypnea, dyspnea, hypoxia.  Patient is PERC negative with negative D dimer.  Pericarditis considered, but felt clinically less likely given atypical features of pain. Will plan to continue on outpatient ibuprofen for pain control. Encouraged f/u with his PCP.  Return precautions discussed and provided. Patient  discharged in stable condition with no unaddressed concerns.  Charles Brock was evaluated in Emergency Department on 12/25/2020 for the symptoms described in the history of present illness. He was evaluated in the context of the global COVID-19 pandemic, which necessitated consideration that the patient might be at risk for infection with the SARS-CoV-2 virus that causes COVID-19. Institutional protocols and algorithms that pertain to the evaluation of patients at risk for COVID-19 are in a state of rapid change based on information released by regulatory bodies including the CDC and federal and state organizations. These policies and algorithms were followed during the patient's care in the ED.   Final Clinical Impression(s) /  ED Diagnoses Final diagnoses:  Right-sided chest pain    Rx / DC Orders ED Discharge Orders    None       Antonietta Breach, PA-C 12/25/20 0407    Merryl Hacker, MD 12/26/20 [phone]

## 2020-12-27 ENCOUNTER — Encounter: Payer: Self-pay | Admitting: Physician Assistant

## 2021-01-03 ENCOUNTER — Other Ambulatory Visit: Payer: Self-pay

## 2021-01-04 ENCOUNTER — Ambulatory Visit (INDEPENDENT_AMBULATORY_CARE_PROVIDER_SITE_OTHER): Payer: BC Managed Care – PPO | Admitting: Physician Assistant

## 2021-01-04 ENCOUNTER — Encounter: Payer: Self-pay | Admitting: Physician Assistant

## 2021-01-04 VITALS — BP 110/70 | HR 46 | Temp 97.6°F | Ht 70.0 in | Wt 227.0 lb

## 2021-01-04 DIAGNOSIS — F320 Major depressive disorder, single episode, mild: Secondary | ICD-10-CM | POA: Diagnosis not present

## 2021-01-04 DIAGNOSIS — R079 Chest pain, unspecified: Secondary | ICD-10-CM

## 2021-01-04 DIAGNOSIS — R9431 Abnormal electrocardiogram [ECG] [EKG]: Secondary | ICD-10-CM | POA: Diagnosis not present

## 2021-01-04 NOTE — Progress Notes (Signed)
Charles Brock is a 30 y.o. male is here for follow up.  I acted as a Education administrator for [employer], PA-C Anselmo Pickler, LPN   History of Present Illness:   Chief Complaint  Patient presents with  . Follow-up    ED    HPI   ED f/u Pt here for f/u from ED visit 01/03 for chest pain. He had negative troponin and normal CXR. D-dimer negative. EKG was concerning for possible acute pericarditis. But his symptoms were not consistent with this. He took ibuprofen for two days and his symptoms resolved. When he stopped his ibuprofen, his symptoms returned. Having right sided chest pain and headaches off and on.  Denies: L sided chest pain, pain worsening with activity, nausea, worst HA of life, vision changes  Depression Pt says he is better. Denies SI/HI. He is seeing counselor that is helping him very much so.  Depression screen [facility] 2/9 01/04/2021 10/19/2020 10/07/2019 04/16/2017  Decreased Interest 1 3 0 0  Down, Depressed, Hopeless 0 3 0 0  PHQ - 2 Score 1 6 0 0  Altered sleeping 1 2 - -  Tired, decreased energy 1 3 - -  Change in appetite 1 3 - -  Feeling bad or failure about yourself  0 2 - -  Trouble concentrating 2 3 - -  Moving slowly or fidgety/restless 1 2 - -  Suicidal thoughts 0 1 - -  PHQ-9 Score 7 22 - -  Difficult doing work/chores Somewhat difficult Very difficult - -     There are no preventive care reminders to display for this patient.  Past Medical History:  Diagnosis Date  . History of chicken pox   . Migraines      Social History   Tobacco Use  . Smoking status: Never Smoker  . Smokeless tobacco: Never Used  Substance Use Topics  . Alcohol use: No  . Drug use: No    History reviewed. No pertinent surgical history.  Family History  Problem Relation Age of Onset  . Hypertension Mother   . Diabetes Mother   . Hypertension Father   . Diabetes Paternal Grandmother   . Diabetes Paternal Grandfather     PMHx, SurgHx, SocialHx, FamHx,  Medications, and Allergies were reviewed in the Visit Navigator and updated as appropriate.   There are no problems to display for this patient.   Social History   Tobacco Use  . Smoking status: Never Smoker  . Smokeless tobacco: Never Used  Substance Use Topics  . Alcohol use: No  . Drug use: No    Current Medications and Allergies:   No current outpatient medications on file.  No Known Allergies  Review of Systems   ROS  Negative unless otherwise specified per HPI.  Vitals:   Vitals:   01/04/21 1123  BP: 110/70  Pulse: (!) 46  Temp: 97.6 F (36.4 C)  TempSrc: Temporal  SpO2: 98%  Weight: 227 lb (103 kg)  Height: 5\' 10"  (1.778 m)     Body mass index is 32.57 kg/m.   Physical Exam:    Physical Exam Vitals and nursing note reviewed.  Constitutional:      General: He is not in acute distress.    Appearance: He is well-developed. He is not ill-appearing, toxic-appearing or sickly-appearing.  Cardiovascular:     Rate and Rhythm: Normal rate and regular rhythm.     Pulses: Normal pulses.     Heart sounds: Normal heart sounds, S1 normal  and S2 normal.     Comments: No LE edema Pulmonary:     Effort: Pulmonary effort is normal.     Breath sounds: Normal breath sounds.  Skin:    General: Skin is warm, dry and intact.  Neurological:     Mental Status: He is alert.     GCS: GCS eye subscore is 4. GCS verbal subscore is 5. GCS motor subscore is 6.  Psychiatric:        Mood and Affect: Mood and affect normal.        Speech: Speech normal.        Behavior: Behavior normal. Behavior is cooperative.      Assessment and Plan:    [name] was seen today for follow-up.  Diagnoses and all orders for this visit:  Right-sided chest pain; Abnormal EKG Notes reviewed. Reassuring that symptoms completely resolve with NSAIDs. Patient requesting cardiology referral, I think this is reasonable. Continue NSAID and worsening precautions advised in the interim. -      Ambulatory referral to Cardiology  Current mild episode of major depressive disorder without prior episode ([place]) Improved. Denies SI/HI. Continue talk therapy. Follow-up as needed.  CMA or LPN served as scribe during this visit. History, Physical, and Plan performed by medical provider. The above documentation has been reviewed and is accurate and complete.   Inda Coke, PA-C [place], [place] 01/04/2021  Follow-up: No follow-ups on file.

## 2021-01-04 NOTE — Patient Instructions (Signed)
Cardiology referral placed. They will be in touch soon regarding your results.  Continue ibuprofen.  Let us know if you have any worsening symptoms in the meantime.  Inda Coke

## 2021-01-12 NOTE — Progress Notes (Signed)
Cardiology Office Note:    Date:  01/14/2021   ID:  Charles Brock, DOB Oct 02, 1991, MRN [id number]  PCP:  Charles Coke, PA  Cardiologist:  No primary care provider on file.  Electrophysiologist:  None   Referring MD: Charles Coke, PA   Chief Complaint  Patient presents with  . Chest Pain   History of Present Illness:    Charles Brock is a 30 y.o. male with no significant past medical history is referred by Charles Coke, PA for evaluation of chest pain.  He was seen in the ED on 12/24/2020 for chest pain.  Diffuse concave ST elevations noted on EKG, concerning for possible acute pericarditis versus early repolarization.  Troponins negative.  He reports that his chest pain started on 1/3, describes a sharp right-sided chest pain.  Worse with deep inspiration.  Chest pain is worse with lying down, improves with leaning forward.  Pain lasted for about 4 hours on 1/3, resolved by the time he was discharged from the ED.  Since that time he has had several more episodes of chest pain that have been similar in description, as describes sharp right-sided pain worse with inspiration or lying flat.  States that he will take ibuprofen 400 mg and pain improves.  Last episode occurred a few days ago.   Past Medical History:  Diagnosis Date  . History of chicken pox   . Migraines     No past surgical history on file.  Current Medications: Current Meds  Medication Sig  . aspirin EC 81 MG tablet Take 81 mg by mouth daily. Swallow whole.  . [DISCONTINUED] colchicine 0.6 MG tablet Take 1 tablet (0.6 mg total) by mouth 2 (two) times daily.  . [DISCONTINUED] ibuprofen (ADVIL) 600 MG tablet Take 1 tablet (600 mg total) by mouth 3 (three) times daily.     Allergies:   Patient has no known allergies.   Social History   Socioeconomic History  . Marital status: Married    Spouse name: Not on file  . Number of children: Not on file  . Years of education: Not on file  . Highest education  level: Not on file  Occupational History  . Not on file  Tobacco Use  . Smoking status: Never Smoker  . Smokeless tobacco: Never Used  Substance and Sexual Activity  . Alcohol use: No  . Drug use: No  . Sexual activity: Yes  Other Topics Concern  . 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  ROS:   Please see the history of present illness.     All other systems reviewed and are negative.  EKGs/Labs/Other Studies Reviewed:    The following studies were reviewed today:   EKG:  EKG is ordered today.  The ekg ordered today demonstrates normal sinus rhythm, rate 55, diffuse concave ST elevations  Recent Labs: 10/19/2020: ALT 16 12/24/2020: BUN 15; Creatinine, Ser 1.17; Hemoglobin 15.3; Platelets 222; Potassium 4.0; Sodium 138  Recent Lipid Panel  Component Value Date/Time   CHOL 123 10/19/2020 0920   TRIG 61 10/19/2020 0920   HDL 37 (L) 10/19/2020 0920   CHOLHDL 3.3 10/19/2020 0920   VLDL 10.6 10/07/2019 0902   LDLCALC 72 10/19/2020 0920    Physical Exam:    VS:  BP 130/90   Pulse (!) 55   Ht $R'5\' 10"'WA$  (1.778 m)   Wt 229 lb (103.9 kg)   SpO2 99%   BMI 32.86 kg/m     Wt Readings from Last 3 Encounters:  01/14/21 229 lb (103.9 kg)  01/04/21 227 lb (103 kg)  12/24/20 222 lb (100.7 kg)     GEN: Well nourished, well developed in no acute distress HEENT: Normal NECK: No JVD; No carotid bruits LYMPHATICS: No lymphadenopathy CARDIAC: RRR, no murmurs, rubs, gallops RESPIRATORY:  Clear to  auscultation without rales, wheezing or rhonchi  ABDOMEN: Soft, non-tender, non-distended MUSCULOSKELETAL:  No edema; No deformity  SKIN: Warm and dry NEUROLOGIC:  Alert and oriented x 3 PSYCHIATRIC:  Normal affect   ASSESSMENT:    1. Acute pericarditis, unspecified type   2. Chest pain of uncertain etiology    PLAN:    Chest pain: Given description of pain (sharp pleuritic chest pain worse with lying flat, relieved with leaning forward) and EKG with diffuse concave ST elevations, presentation is consistent with acute pericarditis. -Echocardiogram to evaluate for pericardial effusion -ESR/CRP -Start colchicine 0.6 mg twice daily for planned 70-month course -Start ibuprofen 600 mg 3 times daily x1 week.  If chest pain resolved after 1 week and ESR/CRP negative, can start to wean NSAIDs at that point.  If continues to have chest pain will need more prolonged NSAID taper  RTC in 3 weeks  Medication Adjustments/Labs and Tests Ordered: Current medicines are reviewed at length with the patient today.  Concerns regarding medicines are outlined above.  Orders Placed This Encounter  Procedures  . Basic metabolic panel  . C-reactive protein  . Sedimentation rate  . ECHOCARDIOGRAM COMPLETE   Meds ordered this encounter  Medications  . DISCONTD: ibuprofen (ADVIL) 600 MG tablet    Sig: Take 1 tablet (600 mg total) by mouth 3 (three) times daily.    Dispense:  60 tablet    Refill:  0  . DISCONTD: colchicine 0.6 MG tablet    Sig: Take 1 tablet (0.6 mg total) by mouth 2 (two) times daily.    Dispense:  60 tablet    Refill:  1  . colchicine 0.6 MG tablet    Sig: Take 1 tablet (0.6 mg total) by mouth 2 (two) times daily.    Dispense:  60 tablet    Refill:  1  . ibuprofen (ADVIL) 600 MG tablet    Sig: Take 1 tablet (600 mg total) by mouth 3 (three) times daily.    Dispense:  60 tablet    Refill:  0    Patient Instructions  Medication Instructions:  START ibuprofen 600 mg three times  daily (every 8 hours)  START colchicine 0.6 mg two times daily  --Call in one week to let us know how your symptoms are.  *If you need a refill on your cardiac medications before your next appointment, please call your pharmacy*   Lab Work: BMET, ESR, CRP today  If you have labs (blood work) drawn today and your tests are completely normal, you will receive your results only by: Marland Kitchen MyChart Message (if you have MyChart) OR . A paper copy in the mail If  you have any lab test that is abnormal or we need to change your treatment, we will call you to review the results.   Testing/Procedures: Your physician has requested that you have an echocardiogram. Echocardiography is a painless test that uses sound waves to create images of your heart. It provides your doctor with information about the size and shape of your heart and how well your heart's chambers and valves are working. This procedure takes approximately one hour. There are no restrictions for this procedure.  This will be done at our [facility] location:  [place]: At [facility], you and your health needs are our priority.  As part of our continuing mission to provide you with exceptional heart care, we have created designated Provider Care Teams.  These Care Teams include your primary Cardiologist (physician) and Advanced Practice Providers (APPs -  Physician Assistants and Nurse Practitioners) who all work together to provide you with the care you need, when you need it.  We recommend signing up for the patient portal called "MyChart".  Sign up information is provided on this After Visit Summary.  MyChart is used to connect with patients for Virtual Visits (Telemedicine).  Patients are able to view lab/test results, encounter notes, upcoming appointments, etc.  Non-urgent messages can be sent to your provider as well.   To learn more about what you can do with MyChart, go to  [email or web address].    Your next appointment:   3 week(s)  The format for your next appointment:   In Person  Provider:   Oswaldo Milian, MD        Signed, Donato Heinz, MD  01/14/2021 5:24 PM    [place]

## 2021-01-14 ENCOUNTER — Encounter: Payer: Self-pay | Admitting: Cardiology

## 2021-01-14 ENCOUNTER — Other Ambulatory Visit: Payer: Self-pay

## 2021-01-14 ENCOUNTER — Ambulatory Visit: Payer: BC Managed Care – PPO | Admitting: Cardiology

## 2021-01-14 VITALS — BP 130/90 | HR 55 | Ht 70.0 in | Wt 229.0 lb

## 2021-01-14 DIAGNOSIS — R079 Chest pain, unspecified: Secondary | ICD-10-CM | POA: Diagnosis not present

## 2021-01-14 DIAGNOSIS — I309 Acute pericarditis, unspecified: Secondary | ICD-10-CM | POA: Diagnosis not present

## 2021-01-14 MED ORDER — COLCHICINE 0.6 MG PO TABS: 0.6000 mg | ORAL_TABLET | Freq: Two times a day (BID) | ORAL | 1 refills | Status: DC

## 2021-01-14 MED ORDER — IBUPROFEN 600 MG PO TABS: 600.0000 mg | ORAL_TABLET | Freq: Three times a day (TID) | ORAL | 0 refills | Status: DC

## 2021-01-14 NOTE — Patient Instructions (Signed)
Medication Instructions:  START ibuprofen 600 mg three times daily (every 8 hours)  START colchicine 0.6 mg two times daily  --Call in one week to let us know how your symptoms are.  *If you need a refill on your cardiac medications before your next appointment, please call your pharmacy*   Lab Work: BMET, ESR, CRP today  If you have labs (blood work) drawn today and your tests are completely normal, you will receive your results only by: Marland Kitchen MyChart Message (if you have MyChart) OR . A paper copy in the mail If you have any lab test that is abnormal or we need to change your treatment, we will call you to review the results.   Testing/Procedures: Your physician has requested that you have an echocardiogram. Echocardiography is a painless test that uses sound waves to create images of your heart. It provides your doctor with information about the size and shape of your heart and how well your heart's chambers and valves are working. This procedure takes approximately one hour. There are no restrictions for this procedure.  This will be done at our [facility] location:  [place]: At [facility], you and your health needs are our priority.  As part of our continuing mission to provide you with exceptional heart care, we have created designated Provider Care Teams.  These Care Teams include your primary Cardiologist (physician) and Advanced Practice Providers (APPs -  Physician Assistants and Nurse Practitioners) who all work together to provide you with the care you need, when you need it.  We recommend signing up for the patient portal called "MyChart".  Sign up information is provided on this After Visit Summary.  MyChart is used to connect with patients for Virtual Visits (Telemedicine).  Patients are able to view lab/test results, encounter notes, upcoming appointments, etc.  Non-urgent messages can be sent to your provider as well.   To learn more  about what you can do with MyChart, go to [email or web address].    Your next appointment:   3 week(s)  The format for your next appointment:   In Person  Provider:   Oswaldo Milian, MD

## 2021-01-15 LAB — BASIC METABOLIC PANEL
BUN/Creatinine Ratio: 8 — ABNORMAL LOW (ref 9–20)
BUN: 9 mg/dL (ref 6–20)
CO2: 23 mmol/L (ref 20–29)
Calcium: 9.6 mg/dL (ref 8.7–10.2)
Chloride: 103 mmol/L (ref 96–106)
Creatinine, Ser: 1.2 mg/dL (ref 0.76–1.27)
GFR calc Af Amer: 93 mL/min/{1.73_m2} (ref >59)
GFR calc non Af Amer: 80 mL/min/{1.73_m2} (ref >59)
Glucose: 79 mg/dL (ref 65–99)
Potassium: 4.7 mmol/L (ref 3.5–5.2)
Sodium: 140 mmol/L (ref 134–144)

## 2021-01-15 LAB — C-REACTIVE PROTEIN: CRP: 4 mg/L (ref 0–10)

## 2021-01-15 LAB — SEDIMENTATION RATE: Sed Rate: 16 mm/hr — ABNORMAL HIGH (ref 0–15)

## 2021-01-17 NOTE — Addendum Note (Signed)
Addended by: Wonda Horner on: 01/17/2021 04:23 PM   Modules accepted: Orders

## 2021-01-23 ENCOUNTER — Telehealth: Payer: Self-pay | Admitting: Cardiology

## 2021-01-23 NOTE — Telephone Encounter (Signed)
Spoke with patient who states that his chest pain is completely gone. Patient states he is currently taking his Ibuprofen and Colchicine as prescribed and wanted to let Dr. Gardiner Rhyme know that his symptoms were much better. Advised patient that I would forward message to Dr. Gardiner Rhyme to make him aware. Advised patient to return call to office with any issues, questions, or concerns. Patient verbalized understanding.

## 2021-01-23 NOTE — Telephone Encounter (Signed)
Pt c/o medication issue:  1. Name of Medication: colchicine 0.6 MG tablet  2. How are you currently taking this medication (dosage and times per day)? As directed  3. Are you having a reaction (difficulty breathing--STAT)? no  4. What is your medication issue? Patient states pain is gone.  Patient was told to f/u with Dr. Gardiner Rhyme after a week of taking the medication. Patient states his pain is gone. Patient wanted to let Dr. Gardiner Rhyme know

## 2021-01-24 NOTE — Telephone Encounter (Signed)
Spoke with patient.  As he is now chest pain free, recommended to start tapering his ibuprofen: will decrease dose to 600 mg twice daily x1 week, then 600 mg daily x1 week then stop.  Continue colchicine for full 3 month course.

## 2021-01-25 ENCOUNTER — Ambulatory Visit: Payer: BC Managed Care – PPO | Admitting: Psychologist

## 2021-02-08 ENCOUNTER — Ambulatory Visit (HOSPITAL_COMMUNITY): Payer: BC Managed Care – PPO | Attending: Internal Medicine

## 2021-02-08 ENCOUNTER — Other Ambulatory Visit: Payer: Self-pay

## 2021-02-08 DIAGNOSIS — I309 Acute pericarditis, unspecified: Secondary | ICD-10-CM | POA: Diagnosis not present

## 2021-02-08 LAB — ECHOCARDIOGRAM COMPLETE
Area-P 1/2: 3.79 cm2
S' Lateral: 2.8 cm

## 2021-02-21 NOTE — Progress Notes (Signed)
Cardiology Office Note:    Date:  02/22/2021   ID:  Charles Brock, DOB Jul 31, 1991, MRN [id number]  PCP:  Inda Coke, PA  Cardiologist:  No primary care provider on file.   Electrophysiologist:  None   Referring MD: Inda Coke, PA   Chief Complaint  Patient presents with  . Chest Pain   History of Present Illness:    Charles Brock is a 30 y.o. male with no significant past medical history who presents for follow-up.  He was referred by Inda Coke, PA for evaluation of chest pain, initially seen on 01/14/2021.  He was seen in the ED on 12/24/2020 for chest pain.  Diffuse concave ST elevations noted on EKG, concerning for possible acute pericarditis versus early repolarization.  Troponins negative.  He reports that his chest pain started on 1/3, describes a sharp right-sided chest pain.  Worse with deep inspiration.  Chest pain is worse with lying down, improves with leaning forward.  Pain lasted for about 4 hours on 1/3, resolved by the time he was discharged from the ED.  Since that time he has had several more episodes of chest pain that have been similar in description, as describes sharp right-sided pain worse with inspiration or lying flat.  States that he will take ibuprofen 400 mg and pain improves.  Last episode occurred a few days ago.  Mildly elevated ESR on 1/24.  Echocardiogram on 2/18 shows normal biventricular function, no significant valvular disease, no pericardial effusion  Since last clinic visit, he reports that he has been doing well.  States that he has not had any chest pain over the last 3 weeks.  He completed his ibuprofen taper.  Continuing to take colchicine.Denies any chest pain, dyspnea, lightheadedness, syncope, lower extremity edema, or palpitations.  Has been walking 1.5 miles every day and does jump rope in the morning, denies any exertional symptoms.   Past Medical History:  Diagnosis Date  . History of chicken pox   . Migraines     No past  surgical history on file.  Current Medications: Current Meds  Medication Sig  . aspirin EC 81 MG tablet Take 81 mg by mouth daily. Swallow whole.  . colchicine 0.6 MG tablet Take 1 tablet (0.6 mg total) by mouth 2 (two) times daily.  Marland Kitchen ibuprofen (ADVIL) 600 MG tablet Take 1 tablet (600 mg total) by mouth 3 (three) times daily.     Allergies:   Patient has no known allergies.   Social History   Socioeconomic History  . Marital status: Married    Spouse name: Not on file  . Number of children: Not on file  . Years of education: Not on file  . Highest education level: Not on file  Occupational History  . Not on file  Tobacco Use  . Smoking status: Never Smoker  . Smokeless tobacco: Never Used  Substance and Sexual Activity  . Alcohol use: No  . Drug use: No  . Sexual activity: Yes  Other Topics Concern  . 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  ROS:   Please see the history of present illness.     All other systems reviewed and are negative.  EKGs/Labs/Other Studies Reviewed:    The following studies were reviewed today:   EKG:  EKG is ordered today.  The ekg ordered today demonstrates normal sinus rhythm, rate 48, no ST abnormalities  Recent Labs: 10/19/2020: ALT 16 12/24/2020: Hemoglobin 15.3; Platelets 222 01/14/2021: BUN 9; Creatinine, Ser 1.20; Potassium 4.7; Sodium 140  Recent Lipid Panel    Component Value Date/Time   CHOL 123 10/19/2020 0920   TRIG 61 10/19/2020 0920   HDL 37  (L) 10/19/2020 0920   CHOLHDL 3.3 10/19/2020 0920   VLDL 10.6 10/07/2019 0902   LDLCALC 72 10/19/2020 0920    Physical Exam:    VS:  BP 128/76   Pulse (!) 58   Ht 5' 10" (1.778 m)   Wt 224 lb (101.6 kg)   SpO2 98%   BMI 32.14 kg/m     Wt Readings from Last 3 Encounters:  02/22/21 224 lb (101.6 kg)  01/14/21 229 lb (103.9 kg)  01/04/21 227 lb (103 kg)     GEN: Well nourished, well developed in no acute distress HEENT: Normal NECK: No JVD; No carotid bruits LYMPHATICS: No lymphadenopathy CARDIAC: RRR, no murmurs, rubs, gallops RESPIRATORY:  Clear to auscultation without rales, wheezing or rhonchi  ABDOMEN: Soft, non-tender, non-distended MUSCULOSKELETAL:  No edema; No deformity  SKIN: Warm and dry NEUROLOGIC:  Alert and oriented x 3 PSYCHIATRIC:  Normal affect   ASSESSMENT:    1. Acute pericarditis, unspecified type    PLAN:    Acute pericarditis: Given description of pain (sharp pleuritic chest pain worse with lying flat, relieved with leaning forward) and EKG with diffuse concave ST elevations, presentation is consistent with acute pericarditis.  Mildly elevated ESR on 1/24.  Echocardiogram on 2/18 shows normal biventricular function, no significant valvular disease, no pericardial effusion -Completed ibuprofen taper, has been chest pain-free -Continue colchicine 0.6 mg twice daily for planned 3-month course through April 2022.    RTC in 3 months  Medication Adjustments/Labs and Tests Ordered: Current medicines are reviewed at length with the patient today.  Concerns regarding medicines are outlined above.  No orders of the defined types were placed in this encounter.  No orders of the defined types were placed in this encounter.   There are no Patient Instructions on file for this visit.   Signed, Christopher L Schumann, MD  02/22/2021 8:48 AM    [facility] 

## 2021-02-22 ENCOUNTER — Other Ambulatory Visit: Payer: Self-pay

## 2021-02-22 ENCOUNTER — Encounter: Payer: Self-pay | Admitting: Cardiology

## 2021-02-22 ENCOUNTER — Ambulatory Visit: Payer: BC Managed Care – PPO | Admitting: Cardiology

## 2021-02-22 VITALS — BP 128/76 | HR 58 | Ht 70.0 in | Wt 224.0 lb

## 2021-02-22 DIAGNOSIS — I309 Acute pericarditis, unspecified: Secondary | ICD-10-CM

## 2021-02-22 MED ORDER — COLCHICINE 0.6 MG PO TABS: 0.6000 mg | ORAL_TABLET | Freq: Two times a day (BID) | ORAL | 1 refills | Status: DC

## 2021-02-22 NOTE — Patient Instructions (Signed)
Medication Instructions:  Take colchicine until the end of April, then STOP  *If you need a refill on your cardiac medications before your next appointment, please call your pharmacy*  Follow-Up: At [facility], you and your health needs are our priority.  As part of our continuing mission to provide you with exceptional heart care, we have created designated Provider Care Teams.  These Care Teams include your primary Cardiologist (physician) and Advanced Practice Providers (APPs -  Physician Assistants and Nurse Practitioners) who all work together to provide you with the care you need, when you need it.  We recommend signing up for the patient portal called "MyChart".  Sign up information is provided on this After Visit Summary.  MyChart is used to connect with patients for Virtual Visits (Telemedicine).  Patients are able to view lab/test results, encounter notes, upcoming appointments, etc.  Non-urgent messages can be sent to your provider as well.   To learn more about what you can do with MyChart, go to [email or web address].    Your next appointment:   3 month(s)  The format for your next appointment:   In Person  Provider:   Oswaldo Milian, MD

## 2021-05-27 ENCOUNTER — Ambulatory Visit: Payer: BC Managed Care – PPO | Admitting: Cardiology

## 2021-05-31 ENCOUNTER — Encounter: Payer: Self-pay | Admitting: Physician Assistant

## 2021-05-31 NOTE — Telephone Encounter (Signed)
That should be okay. 

## 2021-06-02 NOTE — Progress Notes (Deleted)
Cardiology Office Note:    Date:  06/02/2021   ID:  Charles Brock, DOB [birth year], MRN [id number]  PCP:  Inda Coke, PA  Cardiologist:  None   Electrophysiologist:  None   Referring MD: Inda Coke, PA   No chief complaint on file.  History of Present Illness:    Charles Brock is a 30 y.o. male with no significant past medical history who presents for follow-up.  He was referred by Inda Coke, PA for evaluation of chest pain, initially seen on 01/14/2021.  He was seen in the ED on 12/24/2020 for chest pain.  Diffuse concave ST elevations noted on EKG, concerning for possible acute pericarditis versus early repolarization.  Troponins negative.  He reports that his chest pain started on 1/3, describes a sharp right-sided chest pain.  Worse with deep inspiration.  Chest pain is worse with lying down, improves with leaning forward.  Pain lasted for about 4 hours on 1/3, resolved by the time he was discharged from the ED.  Since that time he has had several more episodes of chest pain that have been similar in description, as describes sharp right-sided pain worse with inspiration or lying flat.  States that he will take ibuprofen 400 mg and pain improves.  Last episode occurred a few days ago.  Mildly elevated ESR on 1/24.  Echocardiogram on 2/18 shows normal biventricular function, no significant valvular disease, no pericardial effusion  Since last clinic visit,  he reports that he has been doing well.  States that he has not had any chest pain over the last 3 weeks.  He completed his ibuprofen taper.  Continuing to take colchicine.Denies any chest pain, dyspnea, lightheadedness, syncope, lower extremity edema, or palpitations.  Has been walking 1.5 miles every day and does jump rope in the morning, denies any exertional symptoms.   Past Medical History:  Diagnosis Date   History of chicken pox    Migraines     No past surgical history on file.  Current Medications: No  outpatient medications have been marked as taking for the 06/04/21 encounter (Appointment) with Donato Heinz, MD.     Allergies:   Patient has no known allergies.   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  ROS:   Please see the history of present illness.     All other systems reviewed and are negative.  EKGs/Labs/Other Studies Reviewed:    The following studies were reviewed today:  EKG:  EKG is ordered today.  The ekg ordered today demonstrates normal sinus rhythm, rate 48, no ST abnormalities  Recent Labs: 10/19/2020: ALT 16 12/24/2020: Hemoglobin 15.3; Platelets 222 01/14/2021: BUN 9; Creatinine, Ser 1.20; Potassium 4.7; Sodium 140  Recent Lipid Panel    Component Value Date/Time   CHOL 123 10/19/2020 0920   TRIG 61 10/19/2020 0920   HDL 37 (L) 10/19/2020 0920   CHOLHDL 3.3 10/19/2020 0920   VLDL 10.6 10/07/2019 0902   LDLCALC 72 10/19/2020 0920    Physical Exam:    VS:  There were no vitals taken for this visit.    Wt Readings from Last 3 Encounters:  02/22/21  224 lb (101.6 kg)  01/14/21 229 lb (103.9 kg)  01/04/21 227 lb (103 kg)     GEN: Well nourished, well developed in no acute distress HEENT: Normal NECK: No JVD; No carotid bruits LYMPHATICS: No lymphadenopathy CARDIAC: RRR, no murmurs, rubs, gallops RESPIRATORY:  Clear to auscultation without rales, wheezing or rhonchi  ABDOMEN: Soft, non-tender, non-distended MUSCULOSKELETAL:  No edema; No deformity  SKIN: Warm and dry NEUROLOGIC:  Alert and oriented x 3 PSYCHIATRIC:  Normal affect   ASSESSMENT:    No diagnosis found.  PLAN:    Acute pericarditis: Given description of pain (sharp pleuritic chest pain worse with lying flat, relieved with leaning forward) and EKG with diffuse concave ST elevations, presentation is consistent with acute pericarditis.  Mildly elevated ESR on 1/24.  Echocardiogram on 2/18 shows normal biventricular function, no significant valvular disease, no pericardial effusion -Completed ibuprofen taper, has been chest pain-free -Continue colchicine 0.6 mg twice daily for planned 78-monthcourse through April 2022.    RTC in ***  Medication Adjustments/Labs and Tests Ordered: Current medicines are reviewed at length with the patient today.  Concerns regarding medicines are outlined above.  No orders of the defined types were placed in this encounter.  No orders of the defined types were placed in this encounter.   There are no Patient Instructions on file for this visit.   Signed, CDonato Heinz MD  06/02/2021 4:59 PM    [facility]

## 2021-06-04 ENCOUNTER — Ambulatory Visit: Payer: BC Managed Care – PPO | Admitting: Cardiology

## 2021-09-08 ENCOUNTER — Encounter: Payer: Self-pay | Admitting: Physician Assistant

## 2021-09-08 DIAGNOSIS — L819 Disorder of pigmentation, unspecified: Secondary | ICD-10-CM

## 2021-11-01 ENCOUNTER — Ambulatory Visit (INDEPENDENT_AMBULATORY_CARE_PROVIDER_SITE_OTHER): Payer: BC Managed Care – PPO | Admitting: Physician Assistant

## 2021-11-01 ENCOUNTER — Encounter: Payer: Self-pay | Admitting: Physician Assistant

## 2021-11-01 ENCOUNTER — Other Ambulatory Visit: Payer: Self-pay

## 2021-11-01 VITALS — BP 110/80 | HR 52 | Temp 98.2°F | Ht 70.0 in | Wt 236.5 lb

## 2021-11-01 DIAGNOSIS — L819 Disorder of pigmentation, unspecified: Secondary | ICD-10-CM

## 2021-11-01 DIAGNOSIS — Z0001 Encounter for general adult medical examination with abnormal findings: Secondary | ICD-10-CM

## 2021-11-01 DIAGNOSIS — E669 Obesity, unspecified: Secondary | ICD-10-CM

## 2021-11-01 DIAGNOSIS — M25512 Pain in left shoulder: Secondary | ICD-10-CM | POA: Diagnosis not present

## 2021-11-01 DIAGNOSIS — D179 Benign lipomatous neoplasm, unspecified: Secondary | ICD-10-CM

## 2021-11-01 DIAGNOSIS — F321 Major depressive disorder, single episode, moderate: Secondary | ICD-10-CM

## 2021-11-01 LAB — LIPID PANEL
Cholesterol: 101 mg/dL (ref 0–200)
HDL: 33.4 mg/dL — ABNORMAL LOW (ref >39.00)
LDL Cholesterol: 53 mg/dL (ref 0–99)
NonHDL: 67.31
Total CHOL/HDL Ratio: 3
Triglycerides: 73 mg/dL (ref 0.0–149.0)
VLDL: 14.6 mg/dL (ref 0.0–40.0)

## 2021-11-01 LAB — COMPREHENSIVE METABOLIC PANEL
ALT: 31 U/L (ref 0–53)
AST: 25 U/L (ref 0–37)
Albumin: 4.4 g/dL (ref 3.5–5.2)
Alkaline Phosphatase: 49 U/L (ref 39–117)
BUN: 14 mg/dL (ref 6–23)
CO2: 29 mEq/L (ref 19–32)
Calcium: 9.4 mg/dL (ref 8.4–10.5)
Chloride: 104 mEq/L (ref 96–112)
Creatinine, Ser: 1.22 mg/dL (ref 0.40–1.50)
GFR: 78.73 mL/min (ref >60.00)
Glucose, Bld: 87 mg/dL (ref 70–99)
Potassium: 4.1 mEq/L (ref 3.5–5.1)
Sodium: 139 mEq/L (ref 135–145)
Total Bilirubin: 1.2 mg/dL (ref 0.2–1.2)
Total Protein: 7.5 g/dL (ref 6.0–8.3)

## 2021-11-01 LAB — CBC WITH DIFFERENTIAL/PLATELET
Basophils Absolute: 0 10*3/uL (ref 0.0–0.1)
Basophils Relative: 1 % (ref 0.0–3.0)
Eosinophils Absolute: 0.2 10*3/uL (ref 0.0–0.7)
Eosinophils Relative: 4.7 % (ref 0.0–5.0)
HCT: 43.3 % (ref 39.0–52.0)
Hemoglobin: 14.3 g/dL (ref 13.0–17.0)
Lymphocytes Relative: 48 % — ABNORMAL HIGH (ref 12.0–46.0)
Lymphs Abs: 2.2 10*3/uL (ref 0.7–4.0)
MCHC: 33.1 g/dL (ref 30.0–36.0)
MCV: 86.8 fl (ref 78.0–100.0)
Monocytes Absolute: 0.4 10*3/uL (ref 0.1–1.0)
Monocytes Relative: 7.7 % (ref 3.0–12.0)
Neutro Abs: 1.8 10*3/uL (ref 1.4–7.7)
Neutrophils Relative %: 38.6 % — ABNORMAL LOW (ref 43.0–77.0)
Platelets: 178 10*3/uL (ref 150.0–400.0)
RBC: 4.99 Mil/uL (ref 4.22–5.81)
RDW: 13.6 % (ref 11.5–15.5)
WBC: 4.7 10*3/uL (ref 4.0–10.5)

## 2021-11-01 MED ORDER — KETOCONAZOLE 2 % EX CREA: TOPICAL_CREAM | CUTANEOUS | 0 refills | Status: DC
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
## 2021-11-01 NOTE — Patient Instructions (Addendum)
It was great to see you!  I will put referral in for you to see sports medicine and the general surgeon.  Use the ointment on your rash.  Please go to the lab for blood work.   Our office will call you with your results unless you have chosen to receive results via MyChart.  If your blood work is normal we will follow-up each year for physicals and as scheduled for chronic medical problems.  If anything is abnormal we will treat accordingly and get you in for a follow-up.  Take care,  Aldona Bar

## 2021-11-07 NOTE — Progress Notes (Signed)
    Subjective:    CC: L shoulder pain  I, Molly Weber, LAT, ATC, am serving as scribe for Dr. Lynne Leader.  HPI: Pt is a 30 y/o LHD male presenting w/ c/o L shoulder pain since Sept 2022 w/ no known MOI.  He locates his pain to his L ant-sup shoulder.  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  He does some weightlifting and does note with overhead lifting he is very for shoulder pain.  Neck pain: No Radiating pain: No L shoulder mechanical symptoms: No Aggravating factors: leaning on his R elbow on console in car causes L shoulder pain; overhead lifting; L shoulder F and aBd AROM (aBd>F); functional IR Treatments tried: stretches; massage  Pertinent review of Systems: No fevers or chills  Relevant historical information: Otherwise healthy   Objective:    Vitals:   11/08/21 0905  BP: 104/74  Pulse: (!) 56  SpO2: 99%   General: Well Developed, well nourished, and in no acute distress.   MSK: Left shoulder normal-appearing Range of motion abduction full internal rotation lumbar spine external rotation full. Strength 5/5 abduction 5/5 external rotation 5/5 internal rotation pain with abduction. Positive Hawkins and Neer's test.  Positive empty can test. Positive Yergason's and speeds test. Pulses and capillary refill and sensation are intact distally.  Lab and Radiology Results  Diagnostic Limited MSK Ultrasound of: Left shoulder Biceps tendon intact normal-appearing Subscapularis tendon normal-appearing Supraspinatus tendon intact. Mild subacromial bursitis present. Infraspinatus tendon normal-appearing AC joint normal-appearing Impression: Mild subacromial bursitis  X-ray images left shoulder obtained today personally and independently interpreted No significant degenerative changes.  No acute fractures. Await formal radiology review   Impression and Recommendations:    Assessment and Plan: 30 y.o. male with left shoulder pain  ongoing for several months.  Pain thought to be due to subacromial bursitis.  Clinically he may have some biceps tendinitis although this was not seen on ultrasound.  Plan for trial of physical therapy.  Recheck back in about 6 weeks.  Return sooner if needed.Marland Kitchen  PDMP not reviewed this encounter. Orders Placed This Encounter  Procedures   [place] LIMITED JOINT SPACE STRUCTURES UP LEFT(NO LINKED CHARGES)    Order Specific Question:   Reason for Exam (SYMPTOM  OR DIAGNOSIS REQUIRED)    Answer:   L shoulder pain    Order Specific Question:   Preferred imaging location?    Answer:   [place]   DG Shoulder Left    Standing Status:   Future    Number of Occurrences:   1    Standing Expiration Date:   12/08/2021    Order Specific Question:   Reason for Exam (SYMPTOM  OR DIAGNOSIS REQUIRED)    Answer:   L shoulder pain    Order Specific Question:   Preferred imaging location?    Answer:   [facility]   Ambulatory referral to Physical Therapy    Referral Priority:   Routine    Referral Type:   Physical Medicine    Referral Reason:   Specialty Services Required    Requested Specialty:   Physical Therapy    Number of Visits Requested:   1   No orders of the defined types were placed in this encounter.   Discussed warning signs or symptoms. Please see discharge instructions. Patient expresses understanding.   The above documentation has been reviewed and is accurate and complete Lynne Leader, M.D.

## 2021-11-08 ENCOUNTER — Other Ambulatory Visit: Payer: Self-pay

## 2021-11-08 ENCOUNTER — Ambulatory Visit: Payer: Self-pay

## 2021-11-08 ENCOUNTER — Ambulatory Visit (INDEPENDENT_AMBULATORY_CARE_PROVIDER_SITE_OTHER): Payer: BC Managed Care – PPO

## 2021-11-08 ENCOUNTER — Encounter: Payer: Self-pay | Admitting: Family Medicine

## 2021-11-08 ENCOUNTER — Ambulatory Visit (INDEPENDENT_AMBULATORY_CARE_PROVIDER_SITE_OTHER): Payer: BC Managed Care – PPO | Admitting: Family Medicine

## 2021-11-08 VITALS — BP 104/74 | HR 56 | Ht 70.0 in | Wt 241.8 lb

## 2021-11-08 DIAGNOSIS — Z6834 Body mass index (BMI) 34.0-34.9, adult: Secondary | ICD-10-CM | POA: Diagnosis not present

## 2021-11-08 DIAGNOSIS — M25512 Pain in left shoulder: Secondary | ICD-10-CM

## 2021-11-08 NOTE — Patient Instructions (Signed)
Nice to meet you today.  I've referred you to Physical Therapy.  Please let us know if you have not heard from them in one week regarding scheduling.  Please get an Xray today before you leave.  Follow-up: 6 weeks

## 2021-11-11 NOTE — Progress Notes (Signed)
Left shoulder x-ray looks normal to radiology

## 2021-12-19 NOTE — Progress Notes (Deleted)
° °  I, Wendy Poet, LAT, ATC, am serving as scribe for Dr. Lynne Leader.  Charles Brock is a 30 y.o. male who presents to [place] [facility] today for f/u of L shoulder pain due to subacromial bursitis.  He was last seen by Dr. Georgina Snell on 11/08/21 and was referred to PT at [facility] but has not completed any sessions.  Today, pt reports   Diagnostic testing: L shoulder XR- 11/08/21  Pertinent review of systems: ***  Relevant historical information: ***   Exam:  There were no vitals taken for this visit. General: Well Developed, well nourished, and in no acute distress.   MSK: ***    Lab and Radiology Results No results found for this or any previous visit (from the past 72 hour(s)). No results found.     Assessment and Plan: 30 y.o. male with ***   PDMP not reviewed this encounter. No orders of the defined types were placed in this encounter.  No orders of the defined types were placed in this encounter.    Discussed warning signs or symptoms. Please see discharge instructions. Patient expresses understanding.   ***

## 2021-12-20 ENCOUNTER — Ambulatory Visit: Payer: BC Managed Care – PPO | Admitting: Family Medicine

## 2022-03-02 IMAGING — DX DG SHOULDER 2+V*L*
3 series · 3 of 3 positions shown · non-contrast
Comparison: None.

CLINICAL DATA: Left shoulder pain.

EXAM:
LEFT SHOULDER - 2+ VIEW

[shoulder ap (1 of 2)]
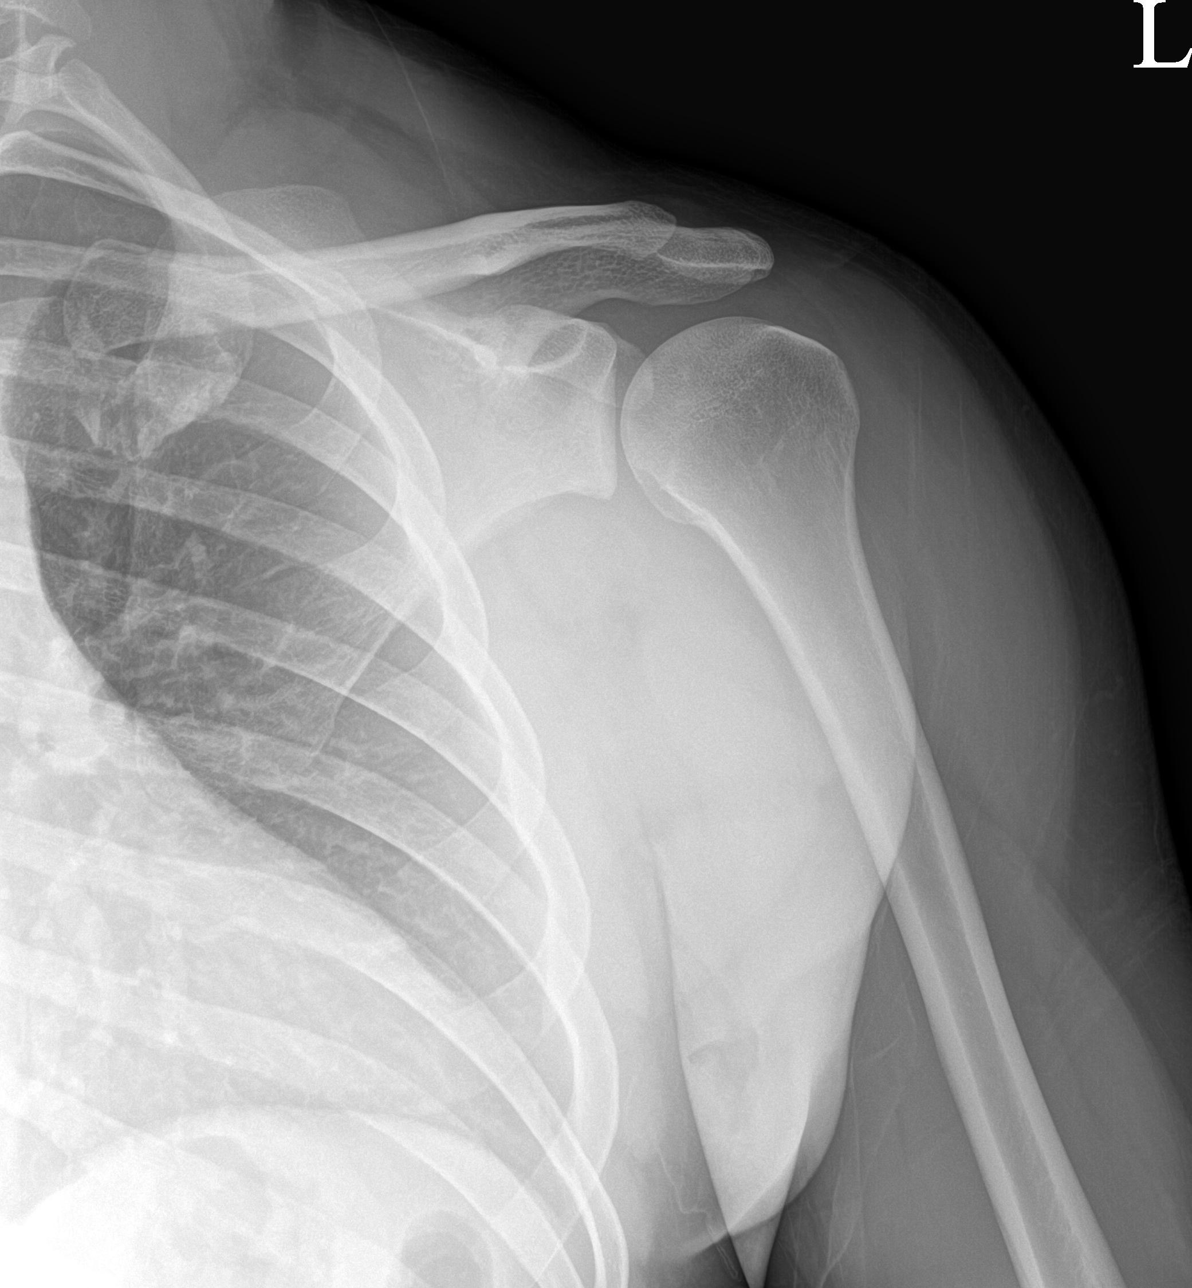

[shoulder ap (2 of 2)]
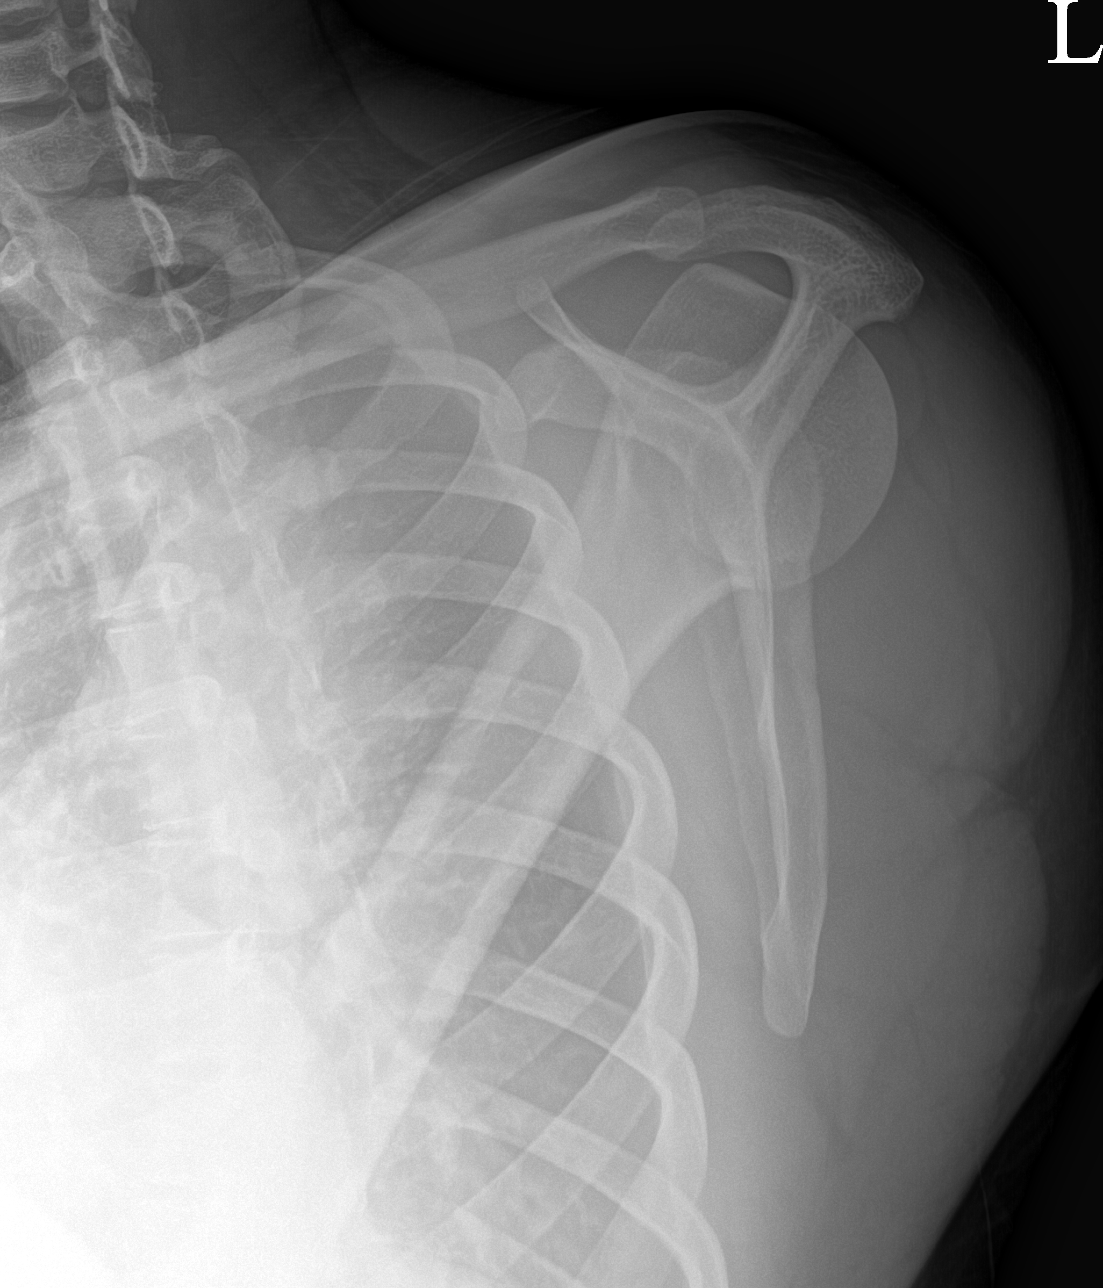

[shoulder axial]
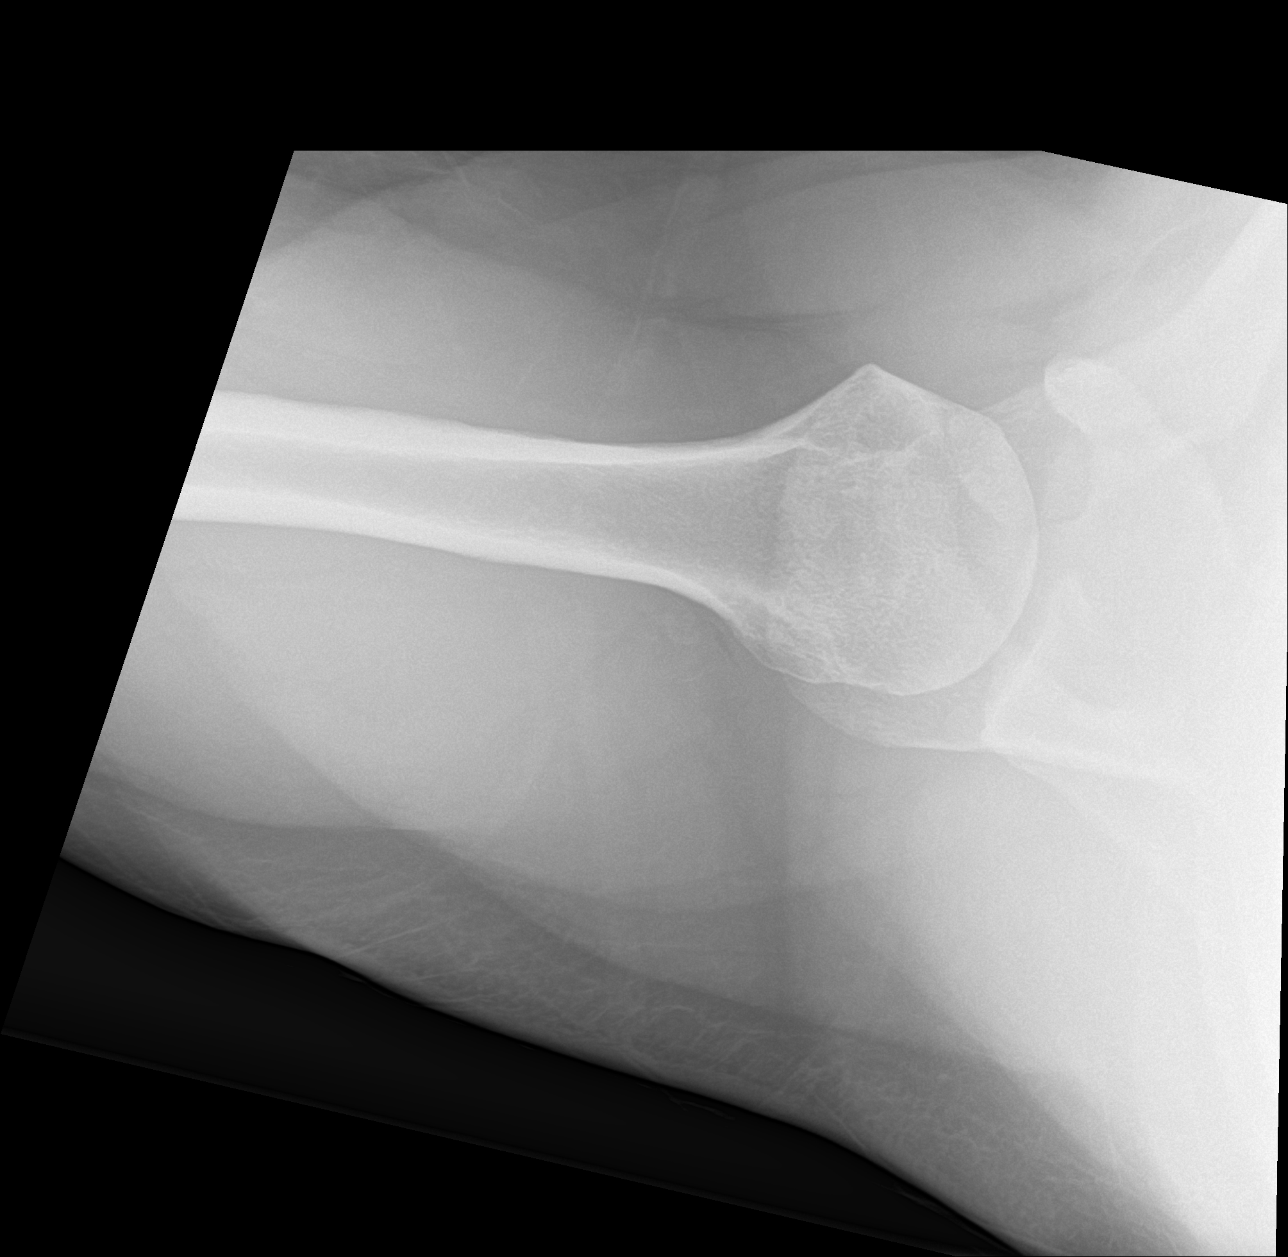

[3 of 3 positions shown; findings below may reference images not displayed]

FINDINGS: There is no evidence of fracture or dislocation. There is no
evidence of arthropathy or other focal bone abnormality. Soft
tissues are unremarkable.
IMPRESSION: Negative.

## 2022-06-30 ENCOUNTER — Other Ambulatory Visit: Payer: Self-pay | Admitting: Physician Assistant

## 2022-06-30 MED ORDER — KETOCONAZOLE 2 % EX CREA: TOPICAL_CREAM | CUTANEOUS | 0 refills | Status: DC

## 2022-09-15 ENCOUNTER — Encounter: Payer: Self-pay | Admitting: *Deleted

## 2022-10-08 ENCOUNTER — Ambulatory Visit (INDEPENDENT_AMBULATORY_CARE_PROVIDER_SITE_OTHER): Payer: BC Managed Care – PPO | Admitting: Physician Assistant

## 2022-10-08 ENCOUNTER — Other Ambulatory Visit (HOSPITAL_COMMUNITY)
Admission: RE | Admit: 2022-10-08 | Discharge: 2022-10-08 | Disposition: A | Discharge status: Home / Self Care | Payer: BC Managed Care – PPO | Source: Ambulatory Visit

## 2022-10-08 ENCOUNTER — Encounter: Payer: Self-pay | Admitting: Physician Assistant

## 2022-10-08 ENCOUNTER — Ambulatory Visit (HOSPITAL_BASED_OUTPATIENT_CLINIC_OR_DEPARTMENT_OTHER)
Admission: RE | Admit: 2022-10-08 | Discharge: 2022-10-08 | Disposition: A | Discharge status: Home / Self Care | Payer: BC Managed Care – PPO | Source: Ambulatory Visit | Attending: Physician Assistant | Admitting: Physician Assistant

## 2022-10-08 VITALS — BP 120/80 | HR 84 | Temp 98.0°F | Ht 70.0 in | Wt 234.5 lb

## 2022-10-08 DIAGNOSIS — R39198 Other difficulties with micturition: Secondary | ICD-10-CM | POA: Insufficient documentation

## 2022-10-08 DIAGNOSIS — N50811 Right testicular pain: Secondary | ICD-10-CM | POA: Diagnosis not present

## 2022-10-08 LAB — POCT URINALYSIS DIPSTICK
Bilirubin, UA: NEGATIVE
Blood, UA: NEGATIVE
Glucose, UA: NEGATIVE
Ketones, UA: NEGATIVE
Nitrite, UA: NEGATIVE
Protein, UA: NEGATIVE
Spec Grav, UA: 1.025 (ref 1.010–1.025)
Urobilinogen, UA: 0.2 E.U./dL
pH, UA: 5.5 (ref 5.0–8.0)

## 2022-10-08 MED ORDER — CEFTRIAXONE SODIUM 500 MG IJ SOLR
500.0000 mg | Freq: Once | INTRAMUSCULAR | Status: AC
  Administered 2022-10-08: 500 mg via INTRAMUSCULAR

## 2022-10-08 MED ORDER — DOXYCYCLINE HYCLATE 100 MG PO TABS: 100.0000 mg | ORAL_TABLET | Freq: Two times a day (BID) | ORAL | 0 refills | Status: DC

## 2022-10-08 NOTE — Patient Instructions (Signed)
It was great to see you!  I suspect that you are dealing with prostatitis and/or epididymitis. You received an antibiotic injection today. Please start the oral doxycycline antibiotic today  I will be in touch with all results  We will get an ultrasound of your scrotum ASAP  IF ANY WORSENING, go to the ER  Take care,  Inda Coke PA-C

## 2022-10-08 NOTE — Progress Notes (Signed)
Charles Brock is a 31 y.o. male here for a new problem.  History of Present Illness:   Chief Complaint  Patient presents with   Urinary Retention    Pt c/o difficulty urinating since Monday, he is having some burning with urination. Also having right flank pain x 1 month off and on. Pt says he has felt feverish but did not take temperature.    HPI  Difficulty urinating; Testicular pain Has had difficulty urinating and dysuria x2-3 days. Feels that he has difficulty emptying his bladder. No similar symptoms in the past. Feels pressure near scrotum with sitting, Sits 8 to 9 hours a day for his work.  Has been feeling constipated for the past few days, last BM was small this morning. Pt complains of subjective fever and right flank pain, flu-like symptoms. Denies having any penile discharge or bladder incontinence. No history of kidney stones. Pt is sexually active, no known STD exposures.  Past Medical History:  Diagnosis Date   History of chicken pox    Migraines      Social History   Tobacco Use   Smoking status: Never   Smokeless tobacco: Never  Substance Use Topics   Alcohol use: No   Drug use: No    History reviewed. No pertinent surgical history.  Family History  Problem Relation Age of Onset   Hypertension Mother    Diabetes Mother    Hypertension Father    Diabetes Paternal Grandmother    Diabetes Paternal Grandfather     No Known Allergies  Current Medications:   Current Outpatient Medications:    aspirin EC 81 MG tablet, Take 81 mg by mouth daily. Swallow whole., Disp: , Rfl:    ibuprofen (ADVIL) 600 MG tablet, Take 1 tablet (600 mg total) by mouth 3 (three) times daily. (Patient taking differently: Take 600 mg by mouth as needed.), Disp: 60 tablet, Rfl: 0   ketoconazole (NIZORAL) 2 % cream, Apply to 1-2 times daily, Disp: 15 g, Rfl: 0   Review of Systems:   Review of Systems  Constitutional:  Positive for fever. Negative for chills, malaise/fatigue  and weight loss.       + decreased appetite  HENT:  Negative for congestion, hearing loss, sinus pain and sore throat.   Respiratory:  Negative for cough and hemoptysis.   Cardiovascular:  Negative for chest pain, palpitations, leg swelling and PND.  Gastrointestinal:  Negative for abdominal pain, constipation, diarrhea, heartburn, nausea and vomiting.  Genitourinary:  Positive for dysuria and flank pain (right). Negative for frequency, hematuria and urgency.       + difficulty urinating - penile discharge  Musculoskeletal:  Positive for back pain. Negative for myalgias and neck pain.  Skin:  Negative for itching and rash.  Neurological:  Negative for dizziness, tingling, seizures and headaches.  Endo/Heme/Allergies:  Negative for polydipsia.  Psychiatric/Behavioral:  Negative for depression. The patient is not nervous/anxious.     Vitals:   Vitals:   10/08/22 1554  BP: 120/80  Pulse: 84  Temp: 98 F (36.7 C)  TempSrc: Temporal  SpO2: 98%  Weight: 234 lb 8 oz (106.4 kg)  Height: '5\' 10"'$  (1.778 m)     Body mass index is 33.65 kg/m.  Physical Exam:   Physical Exam Vitals and nursing note reviewed. Exam conducted with a chaperone present.  Constitutional:      General: He is not in acute distress.    Appearance: He is well-developed. He is not ill-appearing or toxic-appearing.  Cardiovascular:     Rate and Rhythm: Normal rate and regular rhythm.     Pulses: Normal pulses.     Heart sounds: Normal heart sounds, S1 normal and S2 normal.  Pulmonary:     Effort: Pulmonary effort is normal.     Breath sounds: Normal breath sounds.  Abdominal:     Tenderness: There is no right CVA tenderness or left CVA tenderness.  Genitourinary:    Penis: Normal.      Testes:        Right: Tenderness present.  Skin:    General: Skin is warm and dry.  Neurological:     Mental Status: He is alert.     GCS: GCS eye subscore is 4. GCS verbal subscore is 5. GCS motor subscore is 6.   Psychiatric:        Speech: Speech normal.        Behavior: Behavior normal. Behavior is cooperative.    Results for orders placed or performed in visit on 10/08/22  POCT urinalysis dipstick  Result Value Ref Range   Color, UA yellow    Clarity, UA     Glucose, UA Negative Negative   Bilirubin, UA Negative    Ketones, UA Negative    Spec Grav, UA 1.025 1.010 - 1.025   Blood, UA Negative    pH, UA 5.5 5.0 - 8.0   Protein, UA Negative Negative   Urobilinogen, UA 0.2 0.2 or 1.0 E.U./dL   Nitrite, UA Negative    Leukocytes, UA Trace (A) Negative   Appearance     Odor       Assessment and Plan:   Difficulty urinating; Testicular pain, right Exam and symptoms concerning for epididymitis and prostatitis 500 mg rocephin injection Start doxycycline 100 mg BID x 10 days STAT u/s for scrotal pain Urine cytology and urine culture sent Blood work ordered If any worsening, recommend ER evaluation   I,Alexis Herring,acting as a Education administrator for [employer], PA.,have documented all relevant documentation on the behalf of Inda Coke, PA,as directed by  Inda Coke, PA while in the presence of Inda Coke, [place].  I, Inda Coke, [place], have reviewed all documentation for this visit. The documentation on 10/08/22 for the exam, diagnosis, procedures, and orders are all accurate and complete.  Inda Coke PA-C

## 2022-10-09 LAB — CBC WITH DIFFERENTIAL/PLATELET
Basophils Absolute: 0.1 10*3/uL (ref 0.0–0.1)
Basophils Relative: 2.6 % (ref 0.0–3.0)
Eosinophils Absolute: 0.4 10*3/uL (ref 0.0–0.7)
Eosinophils Relative: 6.1 % — ABNORMAL HIGH (ref 0.0–5.0)
HCT: 45.1 % (ref 39.0–52.0)
Hemoglobin: 15 g/dL (ref 13.0–17.0)
Lymphocytes Relative: 49.8 % — ABNORMAL HIGH (ref 12.0–46.0)
Lymphs Abs: 2.9 10*3/uL (ref 0.7–4.0)
MCHC: 33.2 g/dL (ref 30.0–36.0)
MCV: 85.6 fl (ref 78.0–100.0)
Monocytes Absolute: 0.3 10*3/uL (ref 0.1–1.0)
Monocytes Relative: 5.5 % (ref 3.0–12.0)
Neutro Abs: 2.1 10*3/uL (ref 1.4–7.7)
Neutrophils Relative %: 36 % — ABNORMAL LOW (ref 43.0–77.0)
Platelets: 225 10*3/uL (ref 150.0–400.0)
RBC: 5.27 Mil/uL (ref 4.22–5.81)
RDW: 13.5 % (ref 11.5–15.5)
WBC: 5.9 10*3/uL (ref 4.0–10.5)

## 2022-10-09 LAB — COMPREHENSIVE METABOLIC PANEL
ALT: 28 U/L (ref 0–53)
AST: 22 U/L (ref 0–37)
Albumin: 4.6 g/dL (ref 3.5–5.2)
Alkaline Phosphatase: 66 U/L (ref 39–117)
BUN: 12 mg/dL (ref 6–23)
CO2: 28 mEq/L (ref 19–32)
Calcium: 9.9 mg/dL (ref 8.4–10.5)
Chloride: 104 mEq/L (ref 96–112)
Creatinine, Ser: 1.33 mg/dL (ref 0.40–1.50)
GFR: 70.51 mL/min (ref >60.00)
Glucose, Bld: 79 mg/dL (ref 70–99)
Potassium: 4 mEq/L (ref 3.5–5.1)
Sodium: 138 mEq/L (ref 135–145)
Total Bilirubin: 1.1 mg/dL (ref 0.2–1.2)
Total Protein: 7.9 g/dL (ref 6.0–8.3)

## 2022-10-09 LAB — URINE CULTURE
MICRO NUMBER:: 14067974
Result:: NO GROWTH
SPECIMEN QUALITY:: ADEQUATE

## 2022-10-10 LAB — URINE CYTOLOGY ANCILLARY ONLY
Chlamydia: NEGATIVE
Comment: NEGATIVE
Comment: NORMAL
Neisseria Gonorrhea: NEGATIVE

## 2022-10-13 ENCOUNTER — Encounter: Payer: Self-pay | Admitting: Physician Assistant

## 2022-10-13 DIAGNOSIS — N50811 Right testicular pain: Secondary | ICD-10-CM

## 2022-10-14 ENCOUNTER — Ambulatory Visit: Payer: BC Managed Care – PPO | Admitting: Physician Assistant

## 2022-11-17 NOTE — Telephone Encounter (Signed)
Please see message and advise 

## 2022-11-19 DIAGNOSIS — M4723 Other spondylosis with radiculopathy, cervicothoracic region: Secondary | ICD-10-CM | POA: Diagnosis not present

## 2022-11-19 DIAGNOSIS — M47816 Spondylosis without myelopathy or radiculopathy, lumbar region: Secondary | ICD-10-CM | POA: Diagnosis not present

## 2022-12-17 ENCOUNTER — Telehealth: Payer: BC Managed Care – PPO | Admitting: Physician Assistant

## 2022-12-17 ENCOUNTER — Telehealth: Payer: BC Managed Care – PPO | Admitting: Family Medicine

## 2022-12-17 DIAGNOSIS — U071 COVID-19: Secondary | ICD-10-CM

## 2022-12-17 MED ORDER — PROMETHAZINE-DM 6.25-15 MG/5ML PO SYRP: 5.0000 mL | ORAL_SOLUTION | Freq: Four times a day (QID) | ORAL | 0 refills | Status: AC | PRN

## 2022-12-17 MED ORDER — PREDNISONE 20 MG PO TABS: 20.0000 mg | ORAL_TABLET | Freq: Two times a day (BID) | ORAL | 0 refills | Status: AC

## 2022-12-17 NOTE — Patient Instructions (Signed)
Person Under Monitoring Name: Charles Brock  [place]: [address] [id number]  CORONAVIRUS DISEASE 2019 (COVID-19) Guidance for Persons Under Investigation You are being tested for the virus that causes coronavirus disease 2019 (COVID-19). Public health actions are necessary to ensure protection of your health and the health of others, and to prevent further spread of infection. COVID-19 is caused by a virus that can cause symptoms, such as fever, cough, and shortness of breath. The primary transmission from person to person is by coughing or sneezing. On January 20, 2019, the [place]announced a [employer] CorpEmergency of International Concern and on January 21, 2019 the U.S. Department of Health and Human Services declared a public health emergency. If the virus that causesCOVID-19 spreads in the community, it could have severe public health consequences.  As a person under investigation for COVID-19, the [place]advises you to adhere to the following guidance until your test results are reported to you. If your test result is positive, you will receive additional information from your provider and your local health department at that time.  Remain at home until you are cleared by your health provider or public health authorities.  Keep a log of visitors to your home using the form provided. Any visitors to your home must be aware of your isolation status. If you plan to move to a new address or leave the county, notify the local health department in your county. Call a doctor or seek care if you have an urgent medical need. Before seeking medical care, call ahead and get instructions from the provider before arriving at the medical office, clinic or hospital. Notify them that you are being tested for the virus that causes COVID-19 so arrangements can be made, as  necessary, to prevent transmission to others in the healthcare setting. Next, notify the local health department in your county. If a medical emergency arises and you need to call 911, inform the first responders that you are being tested for the virus that causes COVID-19. Next, notify the local health department in your county. Adhere to all guidance set forth by the NM[facility]for [facility] patients that is based on guidance from the Center for Disease Control and Prevention with suspected or confirmed COVID-19. It is provided with this guidance for Persons Under Investigation.  Your health and the health of our community are our top priorities. Public Health officials remain available to provide assistance and counseling to you about COVID-19 and compliance with this guidance.  Provider: ____________________________________________________________ Date: ______/_____/_________  By signing below, you acknowledge that you have read and agree to comply with this Guidance for Persons Under Investigation. ______________________________________________________________ Date: ______/_____/_________  WHO DO I CALL? You can find a list of local health departments here: [email or web address] Department: ____________________________________________________________________ Contact Name: ________________________________________________________________________ Telephone: ___________________________________________________________________________  [facility] COVID-19 Guidance for Persons Under Investigation March 7, 2020COVID-19 COVID-19, or coronavirus disease 2019, is an infection that is caused by a new (novel) coronavirus called SARS-CoV-2. COVID-19 can cause many symptoms. In some people, the virus may not cause any symptoms. In others, it may cause  mild or severe symptoms. Some people with severe infection develop severe disease. What are the causes? This illness is caused by a virus. The virus may be in the air as tiny specks of  fluid (aerosols) or droplets, or it may be on surfaces. You may catch the virus by: Breathing in droplets from an infected person. Droplets can be spread by a person breathing, speaking, singing, coughing, or sneezing. Touching something, like a table or a doorknob, that has virus on it (is contaminated) and then touching your mouth, nose, or eyes. What increases the risk? Risk for infection: You are more likely to get infected with the COVID-19 virus if: You are within 6 ft (1.8 m) of a person with COVID-19 for 15 minutes or longer. You are providing care for a person who is infected with COVID-19. You are in close personal contact with other people. Close personal contact includes hugging, kissing, or sharing eating or drinking utensils. Risk for serious illness caused by COVID-19: You are more likely to get seriously ill from the COVID-19 virus if: You have cancer. You have a long-term (chronic) disease, such as: Chronic lung disease. This includes pulmonary embolism, chronic obstructive pulmonary disease, and cystic fibrosis. Long-term disease that lowers your body's ability to fight infection (immunocompromise). Serious cardiac conditions, such as heart failure, coronary artery disease, or cardiomyopathy. Diabetes. Chronic kidney disease. Liver diseases. These include cirrhosis, nonalcoholic fatty liver disease, alcoholic liver disease, or autoimmune hepatitis. You have obesity. You are pregnant or were recently pregnant. You have sickle cell disease. What are the signs or symptoms? Symptoms of this condition can range from mild to severe. Symptoms may appear any time from 2 to 14 days after being exposed to the virus. They include: Fever or chills. Shortness of breath or trouble breathing. Feeling  tired or very tired. Headaches, body aches, or muscle aches. Runny or stuffy nose, sneezing, coughing, or sore throat. New loss of taste or smell. This is rare. Some people may also have stomach problems, such as nausea, vomiting, or diarrhea. Other people may not have any symptoms of COVID-19. How is this diagnosed? This condition may be diagnosed by testing samples to check for the COVID-19 virus. The most common tests are the PCR test and the antigen test. Tests may be done in the lab or at home. They include: Using a swab to take a sample of fluid from the back of your nose and throat (nasopharyngeal fluid), from your nose, or from your throat. Testing a sample of saliva from your mouth. Testing a sample of coughed-up mucus from your lungs (sputum). How is this treated? Treatment for COVID-19 infection depends on the severity of the condition. Mild symptoms can be managed at home with rest, fluids, and over-the-counter medicines. Serious symptoms may be treated in a hospital intensive care unit (ICU). Treatment in the ICU may include: Supplemental oxygen. Extra oxygen is given through a tube in the nose, a face mask, or a hood. Medicines. These may include: Antivirals, such as monoclonal antibodies. These help your body fight off certain viruses that can cause disease. Anti-inflammatories, such as corticosteroids. These reduce inflammation and suppress the immune system. Antithrombotics. These prevent or treat blood clots, if they develop. Convalescent plasma. This helps boost your immune system, if you have an underlying immunosuppressive condition or are getting immunosuppressive treatments. Prone positioning. This means you will lie on your stomach. This helps oxygen to get into your lungs. Infection control measures. If you are at risk for more serious illness caused by COVID-19, your health care provider may prescribe two long-acting monoclonal antibodies, given together every 6  months. How is this prevented? To protect yourself: Use preventive medicine (pre-exposure prophylaxis). You  may get pre-exposure prophylaxis if you have moderate or severe immunocompromise. Get vaccinated. Anyone 88 months old or older who meets guidelines can get a COVID-19 vaccine or vaccine series. This includes people who are pregnant or making breast milk (lactating). Get an added dose of COVID-19 vaccine after your first vaccine or vaccine series if you have moderate to severe immunocompromise. This applies if you have had a solid organ transplant or have been diagnosed with an immunocompromising condition. You should get the added dose 4 weeks after you got the first COVID-19 vaccine or vaccine series. If you get an mRNA vaccine, you will need a 3-dose primary series. If you get the J&J/Janssen vaccine, you will need a 2-dose primary series, with the second dose being an mRNA vaccine. Talk to your health care provider about getting experimental monoclonal antibodies. This treatment is approved under emergency use authorization to prevent severe illness before or after being exposed to the COVID-19 virus. You may be given monoclonal antibodies if: You have moderate or severe immunocompromise. This includes treatments that lower your immune response. People with immunocompromise may not develop protection against COVID-19 when they are vaccinated. You cannot be vaccinated. You may not get a vaccine if you have a severe allergic reaction to the vaccine or its components. You are not fully vaccinated. You are in a facility where COVID-19 is present and: Are in close contact with a person who is infected with the COVID-19 virus. Are at high risk of being exposed to the COVID-19 virus. You are at risk of illness from new variants of the COVID-19 virus. To protect others: If you have symptoms of COVID-19, take steps to prevent the virus from spreading to others. Stay home. Leave your house only to  get medical care. Do not use public transit, if possible. Do not travel while you are sick. Wash your hands often with soap and water for at least 20 seconds. If soap and water are not available, use alcohol-based hand sanitizer. Make sure that all people in your household wash their hands well and often. Cough or sneeze into a tissue or your sleeve or elbow. Do not cough or sneeze into your hand or into the air. Where to find more information Centers for Disease Control and Prevention: CharmCourses.be World Health Organization: https://www.castaneda.info/ Get help right away if: You have trouble breathing. You have pain or pressure in your chest. You are confused. You have bluish lips and fingernails. You have trouble waking from sleep. You have symptoms that get worse. These symptoms may be an emergency. Get help right away. Call 911. Do not wait to see if the symptoms will go away. Do not drive yourself to the hospital. Summary COVID-19 is an infection that is caused by a new coronavirus. Sometimes, there are no symptoms. Other times, symptoms range from mild to severe. Some people with a severe COVID-19 infection develop severe disease. The virus that causes COVID-19 can spread from person to person through droplets or aerosols from breathing, speaking, singing, coughing, or sneezing. Mild symptoms of COVID-19 can be managed at home with rest, fluids, and over-the-counter medicines. This information is not intended to replace advice given to you by your health care provider. Make sure you discuss any questions you have with your health care provider. Document Revised: 11/26/2021 Document Reviewed: 11/28/2021 Elsevier Patient Education  [employer].

## 2022-12-17 NOTE — Progress Notes (Signed)
Virtual Visit Consent   Charles Brock, you are scheduled for a virtual visit with a Day provider today. Just as with appointments in the office, your consent must be obtained to participate. Your consent will be active for this visit and any virtual visit you may have with one of our providers in the next 365 days. If you have a MyChart account, a copy of this consent can be sent to you electronically.  As this is a virtual visit, video technology does not allow for your provider to perform a traditional examination. This may limit your provider's ability to fully assess your condition. If your provider identifies any concerns that need to be evaluated in person or the need to arrange testing (such as labs, EKG, etc.), we will make arrangements to do so. Although advances in technology are sophisticated, we cannot ensure that it will always work on either your end or our end. If the connection with a video visit is poor, the visit may have to be switched to a telephone visit. With either a video or telephone visit, we are not always able to ensure that we have a secure connection.  By engaging in this virtual visit, you consent to the provision of healthcare and authorize for your insurance to be billed (if applicable) for the services provided during this visit. Depending on your insurance coverage, you may receive a charge related to this service.  I need to obtain your verbal consent now. Are you willing to proceed with your visit today? Eshawn Coor has provided verbal consent on 12/17/2022 for a virtual visit (video or telephone). Dellia Nims, FNP  Date: 12/17/2022 8:21 PM  Virtual Visit via Video Note   I, Dellia Nims, connected with  Charles Brock  ([id number], [birth year]) on 12/17/22 at  8:15 PM EST by a video-enabled telemedicine application and verified that I am speaking with the correct person using two identifiers.  Location: Patient: Virtual Visit Location Patient:  Home Provider: Virtual Visit Location Provider: Home Office   I discussed the limitations of evaluation and management by telemedicine and the availability of in person appointments. The patient expressed understanding and agreed to proceed.    History of Present Illness: Charles Brock is a 31 y.o. who identifies as a male who was assigned male at birth, and is being seen today for cough, wheezing, fever, chills, body aches, weakness, head congestion, and headache for 2 days with positive covid test today. Marland Kitchen  HPI: HPI  Problems:  Patient Active Problem List   Diagnosis Date Noted   BMI 34.0-34.9,adult 11/08/2021    Allergies: No Known Allergies Medications:  Current Outpatient Medications:    predniSONE (DELTASONE) 20 MG tablet, Take 1 tablet (20 mg total) by mouth 2 (two) times daily with a meal for 5 days., Disp: 10 tablet, Rfl: 0   promethazine-dextromethorphan (PROMETHAZINE-DM) 6.25-15 MG/5ML syrup, Take 5 mLs by mouth 4 (four) times daily as needed for up to 10 days for cough., Disp: 118 mL, Rfl: 0   aspirin EC 81 MG tablet, Take 81 mg by mouth daily. Swallow whole., Disp: , Rfl:    doxycycline (VIBRA-TABS) 100 MG tablet, Take 1 tablet (100 mg total) by mouth 2 (two) times daily., Disp: 20 tablet, Rfl: 0   ibuprofen (ADVIL) 600 MG tablet, Take 1 tablet (600 mg total) by mouth 3 (three) times daily. (Patient taking differently: Take 600 mg by mouth as needed.), Disp: 60 tablet, Rfl: 0   ketoconazole (NIZORAL) 2 % cream, Apply  to 1-2 times daily, Disp: 15 g, Rfl: 0  Observations/Objective: Patient is well-developed, well-nourished in no acute distress.  Resting comfortably  at home.  Head is normocephalic, atraumatic.  No labored breathing.  Speech is clear and coherent with logical content.  Patient is alert and oriented at baseline.    Assessment and Plan: 1. COVID-19  Increase fluids, humidifier at night, mvi with zinc and vit d, rest, tylenol or ibuprofen as directed,  urgent care if sx worsen.   Follow Up Instructions: I discussed the assessment and treatment plan with the patient. The patient was provided an opportunity to ask questions and all were answered. The patient agreed with the plan and demonstrated an understanding of the instructions.  A copy of instructions were sent to the patient via MyChart unless otherwise noted below.     The patient was advised to call back or seek an in-person evaluation if the symptoms worsen or if the condition fails to improve as anticipated.  Time:  I spent 10 minutes with the patient via telehealth technology discussing the above problems/concerns.    Dellia Nims, FNP

## 2022-12-17 NOTE — Progress Notes (Signed)
  Thank you for the details you included in the comment boxes. Those details are very helpful in determining the best course of treatment for you and help [place] to provide the best care.Because you are COVID + and have more severe symptoms where antiviral medications need to be discussed (we are not allowed to prescribe through e-visit without a face to face discussion), we recommend that you convert this visit to a video visit in order for the provider to better assess what is going on.  The provider will be able to give you a more accurate diagnosis and treatment plan if we can more freely discuss your symptoms and with the addition of a virtual examination.   If you convert to a video visit, we will bill your insurance (similar to an office visit) and you will not be charged for this e-Visit. You will be able to stay at home and speak with the first available [facility] advanced practice provider. The link to do a video visit is in the drop down Menu tab of your Welcome screen in [place].

## 2023-01-02 ENCOUNTER — Ambulatory Visit (HOSPITAL_COMMUNITY)
Admission: RE | Admit: 2023-01-02 | Discharge: 2023-01-02 | Disposition: A | Discharge status: Home / Self Care | Payer: BC Managed Care – PPO | Source: Ambulatory Visit | Attending: Family Medicine | Admitting: Family Medicine

## 2023-01-02 ENCOUNTER — Ambulatory Visit (INDEPENDENT_AMBULATORY_CARE_PROVIDER_SITE_OTHER): Payer: BC Managed Care – PPO | Admitting: Family Medicine

## 2023-01-02 ENCOUNTER — Other Ambulatory Visit (HOSPITAL_COMMUNITY): Payer: Self-pay

## 2023-01-02 ENCOUNTER — Encounter: Payer: Self-pay | Admitting: Family Medicine

## 2023-01-02 ENCOUNTER — Telehealth: Payer: Self-pay | Admitting: *Deleted

## 2023-01-02 ENCOUNTER — Encounter: Payer: Self-pay | Admitting: Physician Assistant

## 2023-01-02 ENCOUNTER — Ambulatory Visit (HOSPITAL_BASED_OUTPATIENT_CLINIC_OR_DEPARTMENT_OTHER)
Admission: RE | Admit: 2023-01-02 | Discharge: 2023-01-02 | Disposition: A | Discharge status: Home / Self Care | Payer: BC Managed Care – PPO | Source: Ambulatory Visit | Attending: Vascular Surgery | Admitting: Vascular Surgery

## 2023-01-02 VITALS — BP 130/83 | HR 64 | Ht 70.0 in | Wt 236.8 lb

## 2023-01-02 VITALS — BP 122/80 | HR 74 | Temp 98.1°F | Ht 70.0 in | Wt 236.6 lb

## 2023-01-02 DIAGNOSIS — M79662 Pain in left lower leg: Secondary | ICD-10-CM | POA: Insufficient documentation

## 2023-01-02 DIAGNOSIS — I82442 Acute embolism and thrombosis of left tibial vein: Secondary | ICD-10-CM

## 2023-01-02 DIAGNOSIS — U071 COVID-19: Secondary | ICD-10-CM | POA: Diagnosis not present

## 2023-01-02 DIAGNOSIS — S86112A Strain of other muscle(s) and tendon(s) of posterior muscle group at lower leg level, left leg, initial encounter: Secondary | ICD-10-CM

## 2023-01-02 MED ORDER — APIXABAN (ELIQUIS) VTE STARTER PACK (10MG AND 5MG)
ORAL_TABLET | ORAL | 0 refills | Status: DC
  Filled 2023-01-02: qty 74, 30d supply, fill #0

## 2023-01-02 MED ORDER — DICLOFENAC SODIUM 75 MG PO TBEC: 75.0000 mg | DELAYED_RELEASE_TABLET | Freq: Two times a day (BID) | ORAL | 0 refills | Status: DC

## 2023-01-02 MED ORDER — APIXABAN 5 MG PO TABS: 5.0000 mg | ORAL_TABLET | Freq: Two times a day (BID) | ORAL | 1 refills | Status: DC

## 2023-01-02 NOTE — Patient Instructions (Signed)
-  Start apixaban (Eliquis) 10 mg twice daily for 7 days followed by 5 mg twice daily. -[facility]. You will likely need to call the pharmacy to ask them to fill this when you start to run low on your current supply.  -It is important to take your medication around the same time every day.  -Avoid NSAIDs like ibuprofen (Advil, Motrin) and naproxen (Aleve) as well as aspirin doses over 100 mg daily. -Tylenol (acetaminophen) is the preferred over the counter pain medication to lower the risk of bleeding. -Be sure to alert all of your health care providers that you are taking an anticoagulant prior to starting a new medication or having a procedure. -Monitor for signs and symptoms of bleeding (abnormal bruising, prolonged bleeding, nose bleeds, bleeding from gums, discolored urine, black tarry stools). If you have fallen and hit your head OR if your bleeding is severe or not stopping, seek emergency care.  -Go to the emergency room if emergent signs and symptoms of new clot occur (new or worse swelling and pain in an arm or leg, shortness of breath, chest pain, fast or irregular heartbeats, lightheadedness, dizziness, fainting, coughing up blood) or if you experience a significant color change (pale or blue) in the extremity that has the DVT.  -We recommend you wear compression stockings (20-30 mmHg) as long as you are having swelling or pain. Be sure to purchase the correct size and take them off at night. 8.5 inch ankle, 16.5 inch calf, 28 inch thigh  Your next visit is on Friday, February 16th, at [facility] [place], [place], [place] [address] Enter the hospital through Entrance C off [facility] and pull up to the [place] entrance to the free [place] parking.  Check in for your appointment at the [facility].   If you have any questions or need to reschedule an appointment, please call [phone].  If  you are having an emergency, call 911 or present to the nearest emergency room.   What is a DVT?  -Deep vein thrombosis (DVT) is a condition in which a blood clot forms in a vein of the deep venous system which can occur in the lower leg, thigh, pelvis, arm, or neck. This condition is serious and can be life-threatening if the clot travels to the arteries of the lungs and causing a blockage (pulmonary embolism, PE). A DVT can also damage veins in the leg, which can lead to long-term venous disease, leg pain, swelling, discoloration, and ulcers or sores (post-thrombotic syndrome).  -Treatment may include taking an anticoagulant medication to prevent more clots from forming and the current clot from growing, wearing compression stockings, and/or surgical procedures to remove or dissolve the clot.

## 2023-01-02 NOTE — Progress Notes (Signed)
Lower extremity venous left study completed.  Preliminary results relayed to [place], CMA as well as provider at [facility]. Per provider, patient can be seen at [facility] at [facility] today. Patient made aware.   See CV Proc for preliminary results report.   Darlin Coco, RDMS, RVT

## 2023-01-02 NOTE — Patient Instructions (Signed)
Please follow up if symptoms do not improve or as needed.    We will get an ultrasound to be sure you do not have a clot in your lower leg. IF it is positive, we will need to start treatment which is a blood thinner.   IF it is negative, we will treat as a calf muscle strain. Refer to the handout I gave you and take the prescribed antiinflammatory medication. Will need to rest the leg for 1-2 weeks.

## 2023-01-02 NOTE — Progress Notes (Signed)
Subjective  CC:  Chief Complaint  Patient presents with   calf pain    Pt stated that he has had pain in his Lt calf for the past 5 days    Same day acute visit; PCP not available. New pt to me. Chart reviewed.   HPI: Charles Brock is a 32 y.o. male who presents to the office today to address the problems listed above in the chief complaint. 32 yo healthy male with covid 12/27; I reviewed telehealth visit.  He had typical respiratory symptoms, fatigue, some pleuritic chest pain and bodyaches.  No symptoms did resolve although he still has an occasional chest pain and fatigue.  However he was well enough to travel over the holidays.  He drove to [facility] on January 2 and returned on January 5.  2 days afterwards, he noticed left calf pain.  Mostly with walking.  He also noticed some soreness when he was driving.  He went to the gym on the 10th and exercised vigorously for about an hour.  He describes doing [employer], lower extremity workout and weighted calf lifts.  After that workout, his left calf pain worsened.  Since he has noticed some swelling and persistent pain with walking or using his calf muscle.  No weakness.  No thigh pain.  No shortness of breath.  No fevers.  He has reports that he does not take aspirin daily although it is on his medication list.  Assessment  1. Pain of left calf   2. Strain of gastrocnemius muscle of left lower extremity, initial encounter   3. COVID      Plan  Left calf pain: His gastroc muscle is tender diffusely without cords but there is swelling.  Given his recent long car travel and COVID infection will get Dopplers to rule out DVT.  Suspect left gastrocnemius strain due to working out.  Education given.  Will await Dopplers, if positive will treat with anticoagulation.  If negative will treat with anti-inflammatories, rest and stretching exercises.  Handout was given for the sports medicine patient advisor.  He has no respiratory distress, tachypnea,  hypoxia or tachycardia.  Low risk for PE  Follow up: As needed Visit date not found  Orders Placed This Encounter  Procedures   VAS [place] LOWER EXTREMITY VENOUS (DVT)   Meds ordered this encounter  Medications   diclofenac (VOLTAREN) 75 MG EC tablet    Sig: Take 1 tablet (75 mg total) by mouth 2 (two) times daily.    Dispense:  30 tablet    Refill:  0      I reviewed the patients updated PMH, FH, and SocHx.    Patient Active Problem List   Diagnosis Date Noted   BMI 34.0-34.9,adult 11/08/2021   Current Meds  Medication Sig   aspirin EC 81 MG tablet Take 81 mg by mouth daily. Swallow whole.   diclofenac (VOLTAREN) 75 MG EC tablet Take 1 tablet (75 mg total) by mouth 2 (two) times daily.   ibuprofen (ADVIL) 600 MG tablet Take 1 tablet (600 mg total) by mouth 3 (three) times daily. (Patient taking differently: Take 600 mg by mouth as needed.)   ketoconazole (NIZORAL) 2 % cream Apply to 1-2 times daily    Allergies: Patient has No Known Allergies. Family History: Patient family history includes Diabetes in his mother, paternal grandfather, and paternal grandmother; Hypertension in his father and mother. Social History:  Patient  reports that he has never smoked. He has  never used smokeless tobacco. He reports that he does not drink alcohol and does not use drugs.  Review of Systems: Constitutional: Negative for fever malaise or anorexia Cardiovascular: negative for chest pain Respiratory: negative for SOB or persistent cough Gastrointestinal: negative for abdominal pain  Objective  Vitals: BP 122/80   Pulse 74   Temp 98.1 F (36.7 C)   Ht '5\' 10"'$  (1.778 m)   Wt 236 lb 9.6 oz (107.3 kg)   SpO2 98%   BMI 33.95 kg/m  General: no acute distress , A&Ox3, no respiratory distress HEENT: PEERL, conjunctiva normal, neck is supple Cardiovascular:  RRR without murmur or gallop.  Respiratory:  Good breath sounds bilaterally, CTAB with normal respiratory effort Skin:  Warm, no  rashes Left lower extremity: Calf circumference larger on left than right, diffuse tenderness over the gastroc muscle belly, no cords palpated.  No pitting edema.  Positive Homans.  Painful to walk on his toes.  Normal knee exam  Commons side effects, risks, benefits, and alternatives for medications and treatment plan prescribed today were discussed, and the patient expressed understanding of the given instructions. Patient is instructed to call or message via MyChart if he/she has any questions or concerns regarding our treatment plan. No barriers to understanding were identified. We discussed Red Flag symptoms and signs in detail. Patient expressed understanding regarding what to do in case of urgent or emergency type symptoms.  Medication list was reconciled, printed and provided to the patient in AVS. Patient instructions and summary information was reviewed with the patient as documented in the AVS. This note was prepared with assistance of Dragon voice recognition software. Occasional wrong-word or sound-a-like substitutions may have occurred due to the inherent limitations of voice recognition software

## 2023-01-02 NOTE — Telephone Encounter (Signed)
Charles Brock called from [facility] to give critical [place] result of positive acute DVT of lower left calf. Per Dr. Cherlynn Kaiser: see if patient can be seen at [facility] today, if not give the office a call back for further instructions for the patient. Charles Brock verbalized understanding.

## 2023-01-02 NOTE — Progress Notes (Signed)
DVT Clinic Note  Name: Charles Brock     MRN: [id number]     DOB: March 13, 1991     Sex: male  PCP: Inda Coke, PA  Today's Visit: Visit Information: Initial Visit  Referred to DVT Clinic by: Dr. Jonni Sanger ([place]) Referred to CPP by: Dr. Carlis Abbott Reason for referral:  Chief Complaint  Patient presents with   DVT   HISTORY OF PRESENT ILLNESS:  Charles Brock is a 32 y.o. male with no significant PMH who presents after diagnosis of LLE DVT for medication management. Patient reports he tested positive for COVID around 12/13/22 and had a difficult course, including high fevers and feeling very fatigued. It took him a while to recover, and he continued to test positive for a couple weeks with his first negative test on 12/26/22. He usually works out for 1 hr/day but did not move much when he was sick. He works remotely for [employer] but his job does frequently involve travel, including long flights and car rides. He recently drove to [place] on January 2 and returned on January 5. He started to develop pain in his calf on January 7. Denies chest pain, SOB.   Positive Thrombotic Risk Factors: Sedentary journey lasting >8 hours within 4 weeks, Bed rest >72 hours within 3 month, Recent COVID diagnosis (within 3 months) Bleeding Risk Factors: None Present  Negative Thrombotic Risk Factors: Previous VTE, Recent surgery (within 3 months), Recent trauma (within 3 months), Recent admission to hospital with acute illness (within 3 months), Central venous catheterization, Pregnancy, Within 6 weeks postpartum, Recent cesarean section (within 3 months), Estrogen therapy, Testosterone therapy, Active cancer, Erythropoiesis-stimulating agent, Non-malignant, chronic inflammatory condition, Known thrombophilic condition, Smoking, Obesity, Older age  Rx Insurance Coverage: Commercial Rx Affordability: Eliquis and Xarelto copays are both $0. No copay cards needed.  Preferred Pharmacy: Filled  Eliquis starter pack during visit today at [place], and the patient took his first dose during the visit. Refills send to patient's preferred [facility].   Past Medical History:  Diagnosis Date   History of chicken pox    Migraines     No past surgical history on file.  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[employer], no children   Social Determinants of Health   Financial Resource Strain: Not on file  Food Insecurity: Not on file  Transportation Needs: Not on file  Physical Activity: Not on file  Stress: Not on file  Social Connections: Not on file  Intimate Partner Violence: Not on file    Family History  Problem Relation Age of Onset   Hypertension Mother    Diabetes Mother    Hypertension Father    Diabetes Paternal Grandmother    Diabetes Paternal Grandfather     Allergies as of 01/02/2023   (No Known Allergies)    Current  Outpatient Medications on File Prior to Encounter  Medication Sig Dispense Refill   Multiple Vitamin (MULTIVITAMIN WITH MINERALS) TABS tablet Take 2 tablets by mouth daily.     No current facility-administered medications on file prior to encounter.   REVIEW OF SYSTEMS:  Review of Systems  Respiratory:  Negative for shortness of breath.   Cardiovascular:  Positive for leg swelling (LLE). Negative for chest pain and palpitations.  Neurological:  Negative for dizziness.   PHYSICAL EXAMINATION:  Vitals:   01/02/23 1537  BP: 130/83   Pulse: 64  SpO2: 100%  Weight: 236 lb 12.8 oz (107.4 kg)  Height: '5\' 10"'$  (1.778 m)    Body mass index is 33.98 kg/m.  Physical Exam Vitals reviewed.  Cardiovascular:     Rate and Rhythm: Normal rate.  Pulmonary:     Effort: Pulmonary effort is normal.  Musculoskeletal:        General: Tenderness (LLE) present.     Right lower leg: No edema.     Left lower leg: Edema (trace compared to RLE; left calf is larger than right) present.  Psychiatric:        Mood and Affect: Mood normal.        Behavior: Behavior normal.        Thought Content: Thought content normal.   Villalta Score for Post-Thrombotic Syndrome: Pain: Severe Cramps: Severe Heaviness: Severe Paresthesia: Moderate Pruritus: Absent Pretibial Edema: Mild Skin Induration: Absent Hyperpigmentation: Absent Redness: Absent Venous Ectasia: Absent Pain on calf compression: Moderate Villalta Preliminary Score: 14 Is venous ulcer present?: No If venous ulcer is present and score is <15, then 15 points total are assigned: Absent Villalta Total Score: 14  LABS:  CBC     Component Value Date/Time   WBC 5.9 10/08/2022 1616   RBC 5.27 10/08/2022 1616   HGB 15.0 10/08/2022 1616   HCT 45.1 10/08/2022 1616   PLT 225.0 10/08/2022 1616   MCV 85.6 10/08/2022 1616   MCH 28.2 12/24/2020 1553   MCHC 33.2 10/08/2022 1616   RDW 13.5 10/08/2022 1616   LYMPHSABS 2.9 10/08/2022 1616   MONOABS 0.3 10/08/2022 1616   EOSABS 0.4 10/08/2022 1616   BASOSABS 0.1 10/08/2022 1616    Hepatic Function      Component Value Date/Time   PROT 7.9 10/08/2022 1616   ALBUMIN 4.6 10/08/2022 1616   AST 22 10/08/2022 1616   ALT 28 10/08/2022 1616   ALKPHOS 66 10/08/2022 1616   BILITOT 1.1 10/08/2022 1616    Renal Function   Lab Results  Component Value Date   CREATININE 1.33 10/08/2022   CREATININE 1.22 11/01/2021   CREATININE 1.20 01/14/2021    CrCl cannot be calculated (Patient's most recent lab result is older than the maximum  21 days allowed.).   VVS Vascular Lab Studies:  01/02/23 VAS [place] LOWER EXTREMITY VENOUS LEFT (DVT)  Summary:  RIGHT:  - No evidence of common femoral vein obstruction.   LEFT:  - Findings consistent with acute deep vein thrombosis involving the left  posterior tibial veins.  - No cystic structure found in the popliteal fossa.   ASSESSMENT: Location of DVT: Left distal vein Cause of DVT: provoked by a transient risk factor   PLAN: -Start apixaban (Eliquis) 10 mg twice daily for 7 days followed by 5 mg twice daily. -Expected duration of therapy: 3 months. Therapy started on 01/02/23. -Patient educated on purpose, proper use and potential adverse effects of apixaban (Eliquis). -Discussed importance of taking medication around the  same time every day. -Advised patient of medications to avoid (NSAIDs, aspirin doses >100 mg daily). -Educated that Tylenol (acetaminophen) is the preferred analgesic to lower the risk of bleeding. -Advised patient to alert all providers of anticoagulation therapy prior to starting a new medication or having a procedure. -Emphasized importance of monitoring for signs and symptoms of bleeding (abnormal bruising, prolonged bleeding, nose bleeds, bleeding from gums, discolored urine, black tarry stools). -Educated patient to present to the ED if emergent signs and symptoms of new thrombosis occur. -Measured patient for compression stockings. -Counseled patient to wear compression stockings daily, removing at night.  Follow up: 1 month in DVT Clinic.  Rebbeca Paul, PharmD, Para [place], CPP [facility] Clinical Pharmacist Practitioner Office: [phone]

## 2023-01-05 ENCOUNTER — Ambulatory Visit: Payer: BC Managed Care – PPO | Admitting: Physician Assistant

## 2023-01-06 ENCOUNTER — Encounter: Payer: Self-pay | Admitting: Family Medicine

## 2023-01-07 ENCOUNTER — Observation Stay (HOSPITAL_BASED_OUTPATIENT_CLINIC_OR_DEPARTMENT_OTHER)
Admission: EM | Admit: 2023-01-07 | Discharge: 2023-01-09 | Disposition: A | Discharge status: Home / Self Care | Payer: BC Managed Care – PPO | Attending: Internal Medicine | Admitting: Internal Medicine

## 2023-01-07 ENCOUNTER — Telehealth (HOSPITAL_COMMUNITY): Payer: Self-pay | Admitting: Student-PharmD

## 2023-01-07 ENCOUNTER — Emergency Department (HOSPITAL_BASED_OUTPATIENT_CLINIC_OR_DEPARTMENT_OTHER): Payer: BC Managed Care – PPO | Admitting: Radiology

## 2023-01-07 ENCOUNTER — Encounter (HOSPITAL_BASED_OUTPATIENT_CLINIC_OR_DEPARTMENT_OTHER): Payer: Self-pay | Admitting: Emergency Medicine

## 2023-01-07 ENCOUNTER — Other Ambulatory Visit: Payer: Self-pay

## 2023-01-07 DIAGNOSIS — Z8616 Personal history of COVID-19: Secondary | ICD-10-CM | POA: Diagnosis not present

## 2023-01-07 DIAGNOSIS — I2699 Other pulmonary embolism without acute cor pulmonale: Secondary | ICD-10-CM | POA: Diagnosis not present

## 2023-01-07 DIAGNOSIS — I82442 Acute embolism and thrombosis of left tibial vein: Secondary | ICD-10-CM | POA: Diagnosis not present

## 2023-01-07 DIAGNOSIS — Z6834 Body mass index (BMI) 34.0-34.9, adult: Secondary | ICD-10-CM

## 2023-01-07 DIAGNOSIS — R0602 Shortness of breath: Secondary | ICD-10-CM | POA: Diagnosis not present

## 2023-01-07 DIAGNOSIS — Z7901 Long term (current) use of anticoagulants: Secondary | ICD-10-CM | POA: Diagnosis not present

## 2023-01-07 NOTE — Telephone Encounter (Signed)
Patient called with a few follow up questions after his DVT Clinic appt on Friday. Reports he is no longer having pain or swelling in his leg and asks how long he needs to continue wearing compression stockings. Counseled to wear them as long as he is having symptoms. If symptoms have resolved, he does not need to keep wearing them. Also reports that sometimes when he takes a deep breath he has mild chest pain. Pain has not been sudden or severe in onset and describes as the same type of pain from COVID illness. Recommend PCP follow up for this and instructed him to present to the ED for any new severe chest pain. Also notes that his urine has been a darker yellow color and thinks stools have been darker brown in color. Denies blood in urine or stool, denies tarry black quality of stool. He asks if this could be because he swapped his morning and evening doses of Eliquis yesterday. Explained that the Eliquis tablets for morning and evening in the package are the exact same, so he still took the correct dose. Patient's description of urine and stool do not appear to be concerning for hematuria or melena. We can check a CBC for the patient but encouraged him to call his PCP for further workup or present to the ED for any signs of serious bleeding. He confirmed understanding and has no further questions at this time.

## 2023-01-07 NOTE — ED Notes (Signed)
Vasovagal episode while getting blood work

## 2023-01-07 NOTE — ED Triage Notes (Signed)
Dx Covid in dec,and  left leg DVT  Started on eliquis Notice dark stool yesterday,  Headache and SOB Tylenol taked at 9:30 PM

## 2023-01-08 ENCOUNTER — Ambulatory Visit: Payer: BC Managed Care – PPO

## 2023-01-08 ENCOUNTER — Emergency Department (HOSPITAL_BASED_OUTPATIENT_CLINIC_OR_DEPARTMENT_OTHER): Payer: BC Managed Care – PPO

## 2023-01-08 ENCOUNTER — Encounter (HOSPITAL_COMMUNITY): Payer: Self-pay

## 2023-01-08 ENCOUNTER — Telehealth: Payer: Self-pay | Admitting: Physician Assistant

## 2023-01-08 DIAGNOSIS — J9811 Atelectasis: Secondary | ICD-10-CM | POA: Diagnosis not present

## 2023-01-08 DIAGNOSIS — Z8616 Personal history of COVID-19: Secondary | ICD-10-CM | POA: Diagnosis not present

## 2023-01-08 DIAGNOSIS — I2699 Other pulmonary embolism without acute cor pulmonale: Secondary | ICD-10-CM | POA: Diagnosis not present

## 2023-01-08 DIAGNOSIS — R0602 Shortness of breath: Secondary | ICD-10-CM | POA: Diagnosis not present

## 2023-01-08 DIAGNOSIS — I82442 Acute embolism and thrombosis of left tibial vein: Secondary | ICD-10-CM | POA: Diagnosis not present

## 2023-01-08 DIAGNOSIS — Z7901 Long term (current) use of anticoagulants: Secondary | ICD-10-CM | POA: Diagnosis not present

## 2023-01-08 LAB — CBC
HCT: 45.1 % (ref 39.0–52.0)
Hemoglobin: 15.1 g/dL (ref 13.0–17.0)
MCH: 28.4 pg (ref 26.0–34.0)
MCHC: 33.5 g/dL (ref 30.0–36.0)
MCV: 84.8 fL (ref 80.0–100.0)
Platelets: 309 10*3/uL (ref 150–400)
RBC: 5.32 MIL/uL (ref 4.22–5.81)
RDW: 12.7 % (ref 11.5–15.5)
WBC: 6.9 10*3/uL (ref 4.0–10.5)
nRBC: 0 % (ref 0.0–0.2)

## 2023-01-08 LAB — BASIC METABOLIC PANEL
Anion gap: 10 (ref 5–15)
BUN: 21 mg/dL — ABNORMAL HIGH (ref 6–20)
CO2: 25 mmol/L (ref 22–32)
Calcium: 9.5 mg/dL (ref 8.9–10.3)
Chloride: 103 mmol/L (ref 98–111)
Creatinine, Ser: 1.28 mg/dL — ABNORMAL HIGH (ref 0.61–1.24)
GFR, Estimated: >60 mL/min (ref >60)
Glucose, Bld: 104 mg/dL — ABNORMAL HIGH (ref 70–99)
Potassium: 4.5 mmol/L (ref 3.5–5.1)
Sodium: 138 mmol/L (ref 135–145)

## 2023-01-08 LAB — D-DIMER, QUANTITATIVE: D-Dimer, Quant: 0.64 ug/mL-FEU — ABNORMAL HIGH (ref 0.00–0.50)

## 2023-01-08 LAB — TROPONIN I (HIGH SENSITIVITY)
Troponin I (High Sensitivity): 2 ng/L (ref <18)
Troponin I (High Sensitivity): 3 ng/L (ref <18)

## 2023-01-08 LAB — C-REACTIVE PROTEIN: CRP: 0.7 mg/dL (ref <1.0)

## 2023-01-08 LAB — BRAIN NATRIURETIC PEPTIDE: B Natriuretic Peptide: 13 pg/mL (ref 0.0–100.0)

## 2023-01-08 MED ORDER — APIXABAN 2.5 MG PO TABS: 5.0000 mg | ORAL_TABLET | Freq: Two times a day (BID) | ORAL | Status: DC

## 2023-01-08 MED ORDER — SODIUM CHLORIDE 0.9 % IV BOLUS
1000.0000 mL | Freq: Once | INTRAVENOUS | Status: AC
  Administered 2023-01-08: 1000 mL via INTRAVENOUS

## 2023-01-08 MED ORDER — APIXABAN 5 MG PO TABS
10.0000 mg | ORAL_TABLET | Freq: Two times a day (BID) | ORAL | Status: AC
  Administered 2023-01-08 – 2023-01-09 (×2): 10 mg via ORAL
  Filled 2023-01-08 (×2): qty 2

## 2023-01-08 MED ORDER — APIXABAN 2.5 MG PO TABS
10.0000 mg | ORAL_TABLET | Freq: Two times a day (BID) | ORAL | Status: DC
  Administered 2023-01-08: 10 mg via ORAL
  Filled 2023-01-08: qty 4

## 2023-01-08 MED ORDER — IOHEXOL 350 MG/ML SOLN
80.0000 mL | Freq: Once | INTRAVENOUS | Status: AC | PRN
  Administered 2023-01-08: 80 mL via INTRAVENOUS

## 2023-01-08 MED ORDER — APIXABAN 2.5 MG PO TABS: 10.0000 mg | ORAL_TABLET | Freq: Two times a day (BID) | ORAL | Status: DC

## 2023-01-08 MED ORDER — ACETAMINOPHEN 650 MG RE SUPP: 650.0000 mg | Freq: Four times a day (QID) | RECTAL | Status: DC | PRN

## 2023-01-08 MED ORDER — SODIUM CHLORIDE 0.9% FLUSH
3.0000 mL | Freq: Two times a day (BID) | INTRAVENOUS | Status: DC
  Administered 2023-01-08: 3 mL via INTRAVENOUS

## 2023-01-08 MED ORDER — ACETAMINOPHEN 325 MG PO TABS: 650.0000 mg | ORAL_TABLET | Freq: Four times a day (QID) | ORAL | Status: DC | PRN

## 2023-01-08 MED ORDER — APIXABAN 5 MG PO TABS: 5.0000 mg | ORAL_TABLET | Freq: Two times a day (BID) | ORAL | Status: DC

## 2023-01-08 MED ORDER — ACETAMINOPHEN 325 MG PO TABS
650.0000 mg | ORAL_TABLET | Freq: Once | ORAL | Status: AC
  Administered 2023-01-08: 650 mg via ORAL
  Filled 2023-01-08: qty 2

## 2023-01-08 NOTE — Telephone Encounter (Signed)
Pt was seen in [facility] on 01/07/23.  Patient Name: Charles Brock Gender: Male DOB: 1991/02/03 Age: 32 Y 2 M 7 D Return Phone Number: [phone] (Primary) Address: City/ State/ Zip: [place]  [id number] Client [facility] [place] Client Site [facility] [place] Night Provider Inda Coke- [place] Contact Type Call Who Is Calling Patient / Member / Family / Caregiver Call Type Triage / Clinical Relationship To Patient Self Return Phone Number [phone] (Primary) Chief Complaint Headache Reason for Call Request to Schedule Office Appointment Initial Comment Caller states he has an appointment for lab work on Friday, he was wondering if he can schedule an appt for tomorrow. He was recently Dx with DVT, he wants to make sure he does not have any internal bleeding. He has been experiencing extreme headaches. [place] Not Listed UCC vs ED Translation No Nurse Assessment Nurse: Patsey Berthold, RN, [place] Date/Time ([place] Time): 01/07/2023 10:05:14 PM Confirm and document reason for call. If symptomatic, describe symptoms. ---Caller states he has an appointment for lab work on Friday, he was wondering if he can schedule an appt for tomorrow. He was recently Dx with DVT, he wants to make sure he does not have any internal bleeding. He has been experiencing extreme headaches. Reports taking medication recently for pain. Denies recent trauma Does the patient have any new or worsening symptoms? ---Yes Will a triage be completed? ---Yes Related visit to physician within the last 2 weeks? ---Yes Does the PT have any chronic conditions? (i.e. diabetes, asthma, this includes High risk factors for pregnancy, etc.) ---Yes List chronic conditions. ---DVT Is this a behavioral health or substance abuse call? ---No Guidelines Guideline Title Affirmed Question Affirmed Notes Nurse Date/Time (Eastern Time) Headache [1]  SEVERE headache AND [2] sudden-onset (i.e., reaching maximum intensity within seconds to 1 hour) Patsey Berthold, RN, [place] 01/07/2023 10:07:44 PM Disp. Time Eilene Ghazi Time) Disposition Final User 01/07/2023 9:50:45 PM Attempt made - message left Perrin Smack 01/07/2023 10:13:28 PM Go to ED Now (or PCP triage) Yes Patsey Berthold, [place], [place] Final Disposition 01/07/2023 10:13:28 PM Go to ED Now (or PCP triage) Yes Patsey Berthold, RN, [place] Disagree/Comply Comply Caller Understands Yes PreDisposition Did not know what to do Care Advice Given Per Guideline GO TO ED NOW (OR PCP TRIAGE): * IF NO PCP (PRIMARY CARE PROVIDER) SECOND-LEVEL TRIAGE: You need to be seen within the next hour. Go to the [place] at _____________ [place] as soon as you can. ANOTHER ADULT SHOULD DRIVE: * It is better and safer if another adult drives instead of you. CARE ADVICE given per Headache (Adult) guideline. Referrals GO TO FACILITY OTHER - SPECIFY

## 2023-01-08 NOTE — ED Provider Notes (Signed)
[facility] Emergency Department Provider Note MRN:  [id number]  Arrival date & time: 01/08/23     Chief Complaint   Shortness of Breath and Melena   History of Present Illness   Charles Brock is a 32 y.o. year-old male with a history of DVT presenting to the ED with chief complaint of shortness of breath.  Shortness of breath and pleuritic chest pain over the past few days.  Recent diagnosis of left leg DVT.  Has been taking Eliquis for the past 5 or 6 days.  Thought maybe that his stool was a bit darker than normal, urine was a bit darker than normal recently as well.  Review of Systems  A thorough review of systems was obtained and all systems are negative except as noted in the HPI and PMH.   Patient's Health History    Past Medical History:  Diagnosis Date   History of chicken pox    Migraines     History reviewed. No pertinent surgical history.  Family History  Problem Relation Age of Onset   Hypertension Mother    Diabetes Mother    Hypertension Father    Diabetes Paternal Grandmother    Diabetes Paternal Grandfather     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[employer], no children   Social Determinants of Health   Financial Resource Strain: Not on file  Food Insecurity: Not on file  Transportation Needs: Not on file  Physical Activity: Not on file  Stress: Not on file  Social Connections: Not on file  Intimate Partner Violence: Not on file     Physical  Exam   Vitals:   01/08/23 0545 01/08/23 0600  BP: 128/79 123/72  Pulse: 68 71  Resp: 16 16  Temp:  98.3 F (36.8 C)  SpO2: 100% 98%    CONSTITUTIONAL: Well-appearing, NAD NEURO/PSYCH:  Alert and oriented x 3, no focal deficits EYES:  eyes equal and reactive ENT/NECK:  no LAD, no JVD CARDIO: Regular rate, well-perfused, normal S1 and S2 PULM:  CTAB no wheezing or rhonchi GI/GU:  non-distended, non-tender MSK/SPINE:  No gross deformities, no edema SKIN:  no rash, atraumatic   *Additional and/or pertinent findings included in MDM below  Diagnostic and Interventional Summary    EKG Interpretation  Date/Time:  Wednesday January 07 2023 23:38:57 EST Ventricular Rate:  92 PR Interval:  184 QRS Duration: 84 QT Interval:  340 QTC Calculation: 420 R Axis:   96 Text Interpretation: Normal sinus rhythm with sinus arrhythmia Rightward axis Cannot rule out Anterior infarct , age undetermined Abnormal ECG When compared with ECG of 24-Dec-2020 15:44, Vent. rate has increased BY  33 BPM ST no longer elevated in Inferior leads ST no longer elevated in Anterolateral leads QT has lengthened Confirmed by Gerlene Fee [phone]) on 01/08/2023 12:02:38 AM       Labs  Reviewed  BASIC METABOLIC PANEL - Abnormal; Notable for the following components:      Result Value   Glucose, Bld 104 (*)    BUN 21 (*)    Creatinine, Ser 1.28 (*)    All other components within normal limits  CBC  TROPONIN I (HIGH SENSITIVITY)  TROPONIN I (HIGH SENSITIVITY)    CT Angio Chest PE W and/or Wo Contrast  Final Result    DG Chest 2 View  Final Result      Medications  apixaban (ELIQUIS) tablet 10 mg (10 mg Oral Given 01/08/23 1941)    Followed by  apixaban (ELIQUIS) tablet 5 mg (has no administration in time range)  sodium chloride 0.9 % bolus 1,000 mL (0 mLs Intravenous Stopped 01/08/23 0141)  iohexol (OMNIPAQUE) 350 MG/ML injection 80 mL (80 mLs Intravenous Contrast Given 01/08/23 0058)     Procedures   /  Critical Care .Critical Care  Performed by: Maudie Flakes, MD Authorized by: Maudie Flakes, MD   Critical care provider statement:    Critical care time (minutes):  35   Critical care was necessary to treat or prevent imminent or life-threatening deterioration of the following conditions: Acute pulmonary embolism.   Critical care was time spent personally by me on the following activities:  Development of treatment plan with patient or surrogate, discussions with consultants, evaluation of patient's response to treatment, examination of patient, ordering and review of laboratory studies, ordering and review of radiographic studies, ordering and performing treatments and interventions, pulse oximetry, re-evaluation of patient's condition and review of old charts   ED Course and Medical Decision Making  Initial Impression and Ddx Shortness of breath, pleuritic chest pain, recent DVT.  EKG with some notable changes including Q 3 T3.  Will need CTA to exclude PE.  Past medical/surgical history that increases complexity of ED encounter: History of DVT  Interpretation of Diagnostics I personally reviewed the EKG and my interpretation is as follows: Sinus rhythm, new Q wave in lead III, new inverted T wave in lead III  Labs reveal no significant blood count or electrolyte disturbance  Patient Reassessment and Ultimate Disposition/Management Clinical Course as of 01/08/23 0651  Thu Jan 08, 2023  0054 Complaining of dark stool but no black stool, no blood in stool.  H&H normal, doubt significant GI bleeding. [MB]    Clinical Course User Index [MB] Maudie Flakes, MD     CT does demonstrate subsegmental PE despite anticoagulation, admitted to medicine.  Patient management required discussion with the following services or consulting groups:  Hospitalist Service  Complexity of Problems Addressed Acute illness or injury that poses threat of life of bodily function  Additional Data  Reviewed and Analyzed Further history obtained from: Prior labs/imaging results  Additional Factors Impacting ED Encounter Risk Consideration of hospitalization  Barth Kirks. [place] Small, [place] [email or web address]  Final Clinical Impressions(s) / ED Diagnoses     ICD-10-CM   1. SOB (shortness of breath)  R06.02       ED Discharge Orders     None        Discharge Instructions Discussed with and Provided to Patient:   Discharge Instructions   None      Maudie Flakes, MD 01/08/23 [phone]

## 2023-01-08 NOTE — ED Notes (Signed)
Called [facility] and spoke to [place]; informed that patient Bed Assignment is READY

## 2023-01-08 NOTE — ED Notes (Signed)
Am rounding: resting quietly on stretcher, cont on cardiac monitor, voices no complaints at this time, denies any shortness of breath or chest pain. MAE x 4. Remains on RA.

## 2023-01-08 NOTE — H&P (Signed)
History and Physical   Charles Brock JIR:[id number] DOB: 18-Dec-1991 DOA: 01/07/2023  PCP: Inda Coke, PA   Patient coming from: Home  Chief Complaint: SOB and Chest Pain  HPI: Charles Brock is a 32 y.o. male with medical history significant of Migraine and elevated BMI, presenting with shortness of breath and pleuritic chest pain.  Patient was recently diagnosed with left lower extremity DVT on 1/12.  He presented to his PCP that day with ongoing left calf pain and swelling that occurred in the setting of prolonged car travel and recent COVID infection.  He was positive for left lower extremity DVT and was started on Eliquis.  He states he has been taking his Eliquis doses as prescribed.  Per chart review, he reported 2 to 3 days of some shortness of breath and chest pain with deep inspiration.  He also reported concern for possible dark stools that were nonbloody.  On further discussion with the patient he reports that his shortness of breath and pleuritic pain actually started 2 to 3 days prior to his initial leg pain which was about 5 days prior to his presentation to his PCP.  He states that the pain he has been having for the past 2 to 3 days and shortness of breath are both similar to what he experienced prior to his evaluation and prior to starting anticoagulation.  He additionally reports significant headaches for the past 2 days which have now improved.  He denies fevers, chills, abdominal pain, constipation, diarrhea, nausea, vomiting.  ED Course: Vital signs in the ED significant for heart rate in the 50s to 70s.  Lab workup included BMP with BUN of 21 and creatinine stable 1.26, glucose 104.  CBC within normal limits.  Troponin negative x 2.  Chest x-ray with low lung volumes and basilar atelectasis.  CTA PE study showed small subsegmental pulmonary embolus at the right lower lobe posteriorly.  Patient was restarted on Eliquis in the ED and received a liter of fluids as well as  Tylenol.  Review of Systems: As per HPI otherwise all other systems reviewed and are negative.  Past Medical History:  Diagnosis Date   History of chicken pox    Migraines     History reviewed. No pertinent surgical history.  Social History  reports that he has never smoked. He has never used smokeless tobacco. He reports that he does not drink alcohol and does not use drugs.  No Known Allergies  Family History  Problem Relation Age of Onset   Hypertension Mother    Diabetes Mother    Hypertension Father    Diabetes Paternal Grandmother    Diabetes Paternal Grandfather   Reviewed on admission  Prior to Admission medications   Medication Sig Start Date End Date Taking? Authorizing Provider  apixaban (ELIQUIS) 5 MG TABS tablet Take 1 tablet (5 mg total) by mouth 2 (two) times daily. Start taking after completion of starter pack. 01/02/23  Yes Yates, Madison B, RPH-CPP  APIXABAN Arne [place]) VTE STARTER PACK ('10MG'$  AND '5MG'$ ) Take as directed on package: Take 2 tablets (10 mg total) twice daily for 7 days. On day 8, switch to 1 tablet (5 mg total) twice daily. 01/02/23   Rebbeca Paul B, RPH-CPP    Physical Exam: Vitals:   01/08/23 1300 01/08/23 1315 01/08/23 1452 01/08/23 1630  BP: 121/73 126/71  134/83  Pulse: (!) 54 (!) 50  67  Resp: '17 15  16  '$ Temp:   98.7 F (37.1 C) 98.4  F (36.9 C)  TempSrc:   Oral Oral  SpO2: 100% 100%  100%    Physical Exam Constitutional:      General: He is not in acute distress.    Appearance: Normal appearance.  HENT:     Head: Normocephalic and atraumatic.     Mouth/Throat:     Mouth: Mucous membranes are moist.     Pharynx: Oropharynx is clear.  Eyes:     Extraocular Movements: Extraocular movements intact.     Pupils: Pupils are equal, round, and reactive to light.  Cardiovascular:     Rate and Rhythm: Normal rate and regular rhythm.     Pulses: Normal pulses.     Heart sounds: Normal heart sounds.  Pulmonary:     Effort: Pulmonary  effort is normal. No respiratory distress.     Breath sounds: Normal breath sounds.  Abdominal:     General: Bowel sounds are normal. There is no distension.     Palpations: Abdomen is soft.     Tenderness: There is no abdominal tenderness.  Musculoskeletal:        General: No swelling or deformity.  Skin:    General: Skin is warm and dry.  Neurological:     General: No focal deficit present.     Mental Status: Mental status is at baseline.    Labs on Admission: I have personally reviewed following labs and imaging studies  CBC: Recent Labs  Lab 01/07/23 0002  WBC 6.9  HGB 15.1  HCT 45.1  MCV 84.8  PLT 073    Basic Metabolic Panel: Recent Labs  Lab 01/07/23 0002  NA 138  K 4.5  CL 103  CO2 25  GLUCOSE 104*  BUN 21*  CREATININE 1.28*  CALCIUM 9.5    GFR: Estimated Creatinine Clearance: 100.8 mL/min (A) (by C-G formula based on SCr of 1.28 mg/dL (H)).  Liver Function Tests: No results for input(s): "AST", "ALT", "ALKPHOS", "BILITOT", "PROT", "ALBUMIN" in the last 168 hours.  Urine analysis:    Component Value Date/Time   BILIRUBINUR Negative 10/08/2022 1557   PROTEINUR Negative 10/08/2022 1557   UROBILINOGEN 0.2 10/08/2022 1557   NITRITE Negative 10/08/2022 1557   LEUKOCYTESUR Trace (A) 10/08/2022 1557    Radiological Exams on Admission: CT Angio Chest PE W and/or Wo Contrast  Result Date: 01/08/2023 CLINICAL DATA:  Pulmonary embolism (PE) suspected, high prob. Shortness of breath EXAM: CT ANGIOGRAPHY CHEST WITH CONTRAST TECHNIQUE: Multidetector CT imaging of the chest was performed using the standard protocol during bolus administration of intravenous contrast. Multiplanar CT image reconstructions and MIPs were obtained to evaluate the vascular anatomy. RADIATION DOSE REDUCTION: This exam was performed according to the departmental dose-optimization program which includes automated exposure control, adjustment of the mA and/or kV according to patient size  and/or use of iterative reconstruction technique. CONTRAST:  35m OMNIPAQUE IOHEXOL 350 MG/ML SOLN COMPARISON:  None Available. FINDINGS: Cardiovascular: Subsegmental filling defects in the right lower lobe posteriorly compatible with pulmonary emboli. Heart is normal size. Aorta is normal caliber. Mediastinum/Nodes: No mediastinal, hilar, or axillary adenopathy. Trachea and esophagus are unremarkable. Thyroid unremarkable. Lungs/Pleura: No confluent opacities or effusions. Upper Abdomen: No acute findings Musculoskeletal: Mild bilateral gynecomastia. No acute bony abnormality. Review of the MIP images confirms the above findings. IMPRESSION: Small subsegmental pulmonary embolus in the right lower lobe posteriorly. These results were called by telephone at the time of interpretation on 01/08/2023 at 1:14 am to [facility], who verbally acknowledged these results.  Electronically Signed   By: Rolm Baptise M.D.   On: 01/08/2023 01:18   DG Chest 2 View  Result Date: 01/08/2023 CLINICAL DATA:  Shortness of breath EXAM: CHEST - 2 VIEW COMPARISON:  12/24/2020 FINDINGS: Low lung volumes, bibasilar atelectasis. Heart is normal size. Mediastinal contours within normal limits. No effusions or acute bony abnormality. IMPRESSION: Low lung volumes, bibasilar atelectasis. Electronically Signed   By: Rolm Baptise M.D.   On: 01/08/2023 00:03    EKG: Independently reviewed.  Sinus rhythm at 92 bpm.  S1Q3T3 noted.  Assessment/Plan Principal Problem:   Acute pulmonary embolism without acute cor pulmonale, unspecified pulmonary embolism type (HCC) Active Problems:   BMI 34.0-34.9,adult   Acute deep vein thrombosis (DVT) of tibial vein of left lower extremity (HCC)   Acute PE DVT > Patient presenting to the ED with 2 to 3 days of shortness of breath and pleuritic chest pain. Patient had DVT diagnosed 5 days prior and had been taking Eliquis as prescribed. > There was initial concern for possible failure of  anticoagulation.  However, on further questioning it appears that his pulmonary symptoms started previous to his initial evaluation and he likely has had this pulmonary embolus in addition to the DVT prior to starting anticoagulation.  With this additional information do not suspect failure of anticoagulation to be likely. > Overall etiology of DVT/PE appears to be prolonged car travel and recent COVID-19 infection. > I discussed possibly going home this evening versus staying overnight on monitors as planned.  Patient states he does not feel comfortable going home tonight due to that being a change in what he thought was a plan and him living alone with concern for these new diagnoses. - Monitor on telemetry overnight - Echocardiogram - Continue with Eliquis, did receive dose this morning at med center  DVT prophylaxis: Eliquis Code Status:   Full Family Communication:  None on admission Disposition Plan:   Patient is from:  Home  Anticipated DC to:  Home  Anticipated DC date:  1 day  Anticipated DC barriers: None  Consults called:  None Admission status:  Observation, telemetry  Severity of Illness: The appropriate patient status for this patient is OBSERVATION. Observation status is judged to be reasonable and necessary in order to provide the required intensity of service to ensure the patient's safety. The patient's presenting symptoms, physical exam findings, and initial radiographic and laboratory data in the context of their medical condition is felt to place them at decreased risk for further clinical deterioration. Furthermore, it is anticipated that the patient will be medically stable for discharge from the hospital within 2 midnights of admission.    Marcelyn Bruins MD [facility]  How to contact the [facility] Attending or Consulting provider [place] or covering provider during after hours [place], for this patient?   Check the care team in [facility] and look for a)  attending/consulting [facility] provider listed and b) the [facility] team listed Log into [email or web address] and use [place]'s universal password to access. If you do not have the password, please contact the hospital operator. Locate the [facility] provider you are looking for under [facility] and page to a number that you can be directly reached. If you still have difficulty reaching the provider, please page the [facility] (Director on Call) for the Hospitalists listed on amion for assistance.  01/08/2023, 5:22 PM

## 2023-01-08 NOTE — ED Notes (Signed)
CONTACT NUMBER - Kofi [phone]

## 2023-01-08 NOTE — ED Notes (Signed)
Called Patient Placement; spoke to [facility]; was informed no available beds yet, no discharges as of yet.

## 2023-01-09 ENCOUNTER — Observation Stay (HOSPITAL_COMMUNITY): Payer: BC Managed Care – PPO

## 2023-01-09 ENCOUNTER — Observation Stay (HOSPITAL_BASED_OUTPATIENT_CLINIC_OR_DEPARTMENT_OTHER): Payer: BC Managed Care – PPO

## 2023-01-09 ENCOUNTER — Ambulatory Visit: Payer: BC Managed Care – PPO | Admitting: Physician Assistant

## 2023-01-09 DIAGNOSIS — N179 Acute kidney failure, unspecified: Secondary | ICD-10-CM | POA: Diagnosis not present

## 2023-01-09 DIAGNOSIS — I5031 Acute diastolic (congestive) heart failure: Secondary | ICD-10-CM

## 2023-01-09 DIAGNOSIS — I2699 Other pulmonary embolism without acute cor pulmonale: Secondary | ICD-10-CM | POA: Diagnosis not present

## 2023-01-09 LAB — MAGNESIUM: Magnesium: 2 mg/dL (ref 1.7–2.4)

## 2023-01-09 LAB — ECHOCARDIOGRAM COMPLETE
Area-P 1/2: 2.55 cm2
Calc EF: 68.3 %
Height: 70 in
S' Lateral: 2.7 cm
Single Plane A2C EF: 71.2 %
Single Plane A4C EF: 63.7 %
Weight: 3788.38 oz

## 2023-01-09 LAB — CBC WITH DIFFERENTIAL/PLATELET
Abs Immature Granulocytes: 0.01 10*3/uL (ref 0.00–0.07)
Basophils Absolute: 0 10*3/uL (ref 0.0–0.1)
Basophils Relative: 1 %
Eosinophils Absolute: 0.2 10*3/uL (ref 0.0–0.5)
Eosinophils Relative: 5 %
HCT: 43.8 % (ref 39.0–52.0)
Hemoglobin: 14.9 g/dL (ref 13.0–17.0)
Immature Granulocytes: 0 %
Lymphocytes Relative: 51 %
Lymphs Abs: 2.2 10*3/uL (ref 0.7–4.0)
MCH: 29.3 pg (ref 26.0–34.0)
MCHC: 34 g/dL (ref 30.0–36.0)
MCV: 86.2 fL (ref 80.0–100.0)
Monocytes Absolute: 0.3 10*3/uL (ref 0.1–1.0)
Monocytes Relative: 7 %
Neutro Abs: 1.6 10*3/uL — ABNORMAL LOW (ref 1.7–7.7)
Neutrophils Relative %: 36 %
Platelets: 261 10*3/uL (ref 150–400)
RBC: 5.08 MIL/uL (ref 4.22–5.81)
RDW: 13 % (ref 11.5–15.5)
WBC: 4.4 10*3/uL (ref 4.0–10.5)
nRBC: 0 % (ref 0.0–0.2)

## 2023-01-09 LAB — BASIC METABOLIC PANEL
Anion gap: 8 (ref 5–15)
BUN: 11 mg/dL (ref 6–20)
CO2: 26 mmol/L (ref 22–32)
Calcium: 9.3 mg/dL (ref 8.9–10.3)
Chloride: 103 mmol/L (ref 98–111)
Creatinine, Ser: 1.29 mg/dL — ABNORMAL HIGH (ref 0.61–1.24)
GFR, Estimated: >60 mL/min (ref >60)
Glucose, Bld: 102 mg/dL — ABNORMAL HIGH (ref 70–99)
Potassium: 3.9 mmol/L (ref 3.5–5.1)
Sodium: 137 mmol/L (ref 135–145)

## 2023-01-09 LAB — HIV ANTIBODY (ROUTINE TESTING W REFLEX): HIV Screen 4th Generation wRfx: NONREACTIVE

## 2023-01-09 LAB — D-DIMER, QUANTITATIVE: D-Dimer, Quant: 0.61 ug/mL-FEU — ABNORMAL HIGH (ref 0.00–0.50)

## 2023-01-09 LAB — C-REACTIVE PROTEIN: CRP: <0.5 mg/dL

## 2023-01-09 LAB — BRAIN NATRIURETIC PEPTIDE: B Natriuretic Peptide: 17 pg/mL (ref 0.0–100.0)

## 2023-01-09 MED ORDER — LACTATED RINGERS IV SOLN: INTRAVENOUS | Status: DC

## 2023-01-09 NOTE — Progress Notes (Signed)
Ambulatory Oxygen saturation done, pt. Ambulated in hall way without difficulty, O2 sat remained 98-100% on R/A, no c/o SOB, no distress noted, HR 106  Anastasio Auerbach, RN

## 2023-01-09 NOTE — Discharge Instructions (Signed)
Follow with Primary MD Inda Coke, PA in 7 days   Get CBC, CMP -  checked next visit with your primary MD  Activity: As tolerated with Full fall precautions use walker/cane & assistance as needed  Disposition Home   Diet: Heart Healthy   Special Instructions: If you have smoked or chewed Tobacco  in the last 2 yrs please stop smoking, stop any regular Alcohol  and or any Recreational drug use.  On your next visit with your primary care physician please Get Medicines reviewed and adjusted.  Please request your Prim.MD to go over all Hospital Tests and Procedure/Radiological results at the follow up, please get all Hospital records sent to your Prim MD by signing hospital release before you go home.  If you experience worsening of your admission symptoms, develop shortness of breath, life threatening emergency, suicidal or homicidal thoughts you must seek medical attention immediately by calling 911 or calling your MD immediately  if symptoms less severe.  You Must read complete instructions/literature along with all the possible adverse reactions/side effects for all the Medicines you take and that have been prescribed to you. Take any new Medicines after you have completely understood and accpet all the possible adverse reactions/side effects.

## 2023-01-09 NOTE — Plan of Care (Signed)

## 2023-01-09 NOTE — Progress Notes (Signed)
Echocardiogram 2D Echocardiogram has been performed.  Charles Brock 01/09/2023, 9:05 AM

## 2023-01-09 NOTE — Progress Notes (Signed)
DISCHARGE NOTE HOME Charles Brock to be discharged Home per MD order. Discussed prescriptions and follow up appointments with the patient. Prescriptions given to patient; medication list explained in detail. Patient verbalized understanding.  Skin clean, dry and intact without evidence of skin break down, no evidence of skin tears noted. IV catheter discontinued intact. Site without signs and symptoms of complications. Dressing and pressure applied. Pt denies pain at the site currently. No complaints noted.  Patient free of lines, drains, and wounds.   An After Visit Summary (AVS) was printed and given to the patient. Patient escorted via wheelchair, and discharged home via private auto.  Anastasio Auerbach, RN

## 2023-01-09 NOTE — TOC Transition Note (Signed)
Transition of Care [facility]) - CM/SW Discharge Note   Patient Details  Name: Jeriko Kowalke MRN: [id number] Date of Birth: Sep 23, 1991  Transition of [facility]) CM/SW Contact:  Tom-Johnson, Renea Ee, RN Phone Number: 01/09/2023, 11:56 AM   Clinical Narrative:     Patient is scheduled for discharge today. 30 days Eliquis coupon given. No PT/OT needs or recommendations noted. Denies any other needs. Family to transport at discharge. No further TOC needs noted.   Final next level of care: Home/Self Care Barriers to Discharge: Barriers Resolved   Patient Goals and CMS Choice CMS Medicare.gov Compare Post Acute Care list provided to:: Patient Choice offered to / list presented to : NA  Discharge Placement                  Patient to be transferred to facility by: Family      Discharge Plan and Services Additional resources added to the After Visit Summary for                  DME Arranged: N/A DME Agency: NA       HH Arranged: NA [place] Agency: NA        Social Determinants of Health (SDOH) Interventions SDOH Screenings   Depression (PHQ2-9): Low Risk  (01/02/2023)  Tobacco Use: Low Risk  (01/07/2023)     Readmission Risk Interventions     No data to display

## 2023-01-09 NOTE — Discharge Summary (Signed)
Charles Brock TGY:[id number] DOB: [birth year] DOA: 01/07/2023  PCP: Inda Coke, PA  Admit date: 01/07/2023  Discharge date: 01/09/2023  Admitted From: Home   Disposition:  Home   Recommendations for Outpatient Follow-up:   Follow up with PCP in 1-2 weeks  PCP Please obtain BMP/CBC, 2 view CXR in 1week,  (see Discharge instructions)   PCP Please follow up on the following pending results: BMP closely, outpatient hematology follow-up if desired for hypercoagulable workup.   Home Health: None   Equipment/Devices: None  Consultations: None  Discharge Condition: Stable    CODE STATUS: Full    Diet Recommendation: Heart Healthy   Diet Order             Diet regular Room service appropriate? Yes; Fluid consistency: Thin  Diet effective now                    Chief Complaint  Patient presents with   Shortness of Breath   Melena     Brief history of present illness from the day of admission and additional interim summary    32 y.o. male with medical history significant of Migraine and elevated BMI, presenting with shortness of breath and pleuritic chest pain.   Patient was recently diagnosed with left lower extremity DVT on 1/12.  He presented to his PCP that day with ongoing left calf pain and swelling that occurred in the setting of prolonged car travel and recent COVID infection.  He was positive for left lower extremity DVT and was started on Eliquis.   He states he has been taking his Eliquis doses as prescribed.  Per chart review, he reported 2 to 3 days of some shortness of breath and chest pain with deep inspiration.  He also reported concern for possible dark stools that were nonbloody.  His workup was suggestive of PE and he was admitted for further care.                                                                  Hospital Course   Acute PE and recent acute left lower extremity DVT.  Patient recently had COVID and was diagnosed with DVT outpatient 5 days ago, he was placed on Eliquis, subsequently he developed some pleuritic chest pain and exertional shortness of breath and was diagnosed with small subsegmental PE in the ER, he likely developed blood clots due to transient hypercoagulable state caused from [place] along with dehydration caused from [place].  His D-dimer is barely elevated, he is hemodynamically stable, ambulated in the hallway on room air without any distress, CTA noted with very tiny clot burden of subsegmental PE.  Echocardiogram unremarkable with preserved EF, preserved right and left ventricular function, he is symptom-free and will be discharged home on Eliquis.  He should be on Eliquis for minimum 6 to 9 months will defer this to PCP.  May consider one-time outpatient hematology follow-up.  Will defer this to PCP as well.  Mild AKI.  Resolved, gentle IV fluids provided.  Baseline creatinine is around 1.24 several months now, PCP to monitor intermittently UA and renal ultrasound stable.  Recent COVID.  Clinically resolved.  Stable inflammatory markers not suggestive of any active inflammation.   Discharge diagnosis     Principal Problem:   Acute pulmonary embolism without acute cor pulmonale, unspecified pulmonary embolism type (HCC) Active Problems:   BMI 34.0-34.9,adult   Acute deep vein thrombosis (DVT) of tibial vein of left lower extremity [facility])    Discharge instructions    Discharge Instructions     Discharge instructions   Complete by: As directed    Follow with Primary MD Inda Coke, PA in 7 days   Get CBC, CMP -  checked next visit with your primary MD  Activity: As tolerated with Full fall precautions use walker/cane & assistance as needed  Disposition Home   Diet: Heart Healthy   Special Instructions: If you have smoked or  chewed Tobacco  in the last 2 yrs please stop smoking, stop any regular Alcohol  and or any Recreational drug use.  On your next visit with your primary care physician please Get Medicines reviewed and adjusted.  Please request your Prim.MD to go over all Hospital Tests and Procedure/Radiological results at the follow up, please get all Hospital records sent to your Prim MD by signing hospital release before you go home.  If you experience worsening of your admission symptoms, develop shortness of breath, life threatening emergency, suicidal or homicidal thoughts you must seek medical attention immediately by calling 911 or calling your MD immediately  if symptoms less severe.  You Must read complete instructions/literature along with all the possible adverse reactions/side effects for all the Medicines you take and that have been prescribed to you. Take any new Medicines after you have completely understood and accpet all the possible adverse reactions/side effects.       Discharge Medications   Allergies as of 01/09/2023   No Known Allergies      Medication List     TAKE these medications    Eliquis DVT/PE Starter Pack Generic drug: Apixaban Starter Pack ('10mg'$  and '5mg'$ ) Take as directed on package: Take 2 tablets (10 mg total) twice daily for 7 days. On day 8, switch to 1 tablet (5 mg total) twice daily.   apixaban 5 MG Tabs tablet Commonly known as: Eliquis Take 1 tablet (5 mg total) by mouth 2 (two) times daily. Start taking after completion of starter pack.         Follow-up Information     Inda Coke, [place]. Schedule an appointment as soon as possible for a visit in 1 week(s).   Specialty: Physician Assistant Contact information: [place] [address] [phone]                 Major procedures and Radiology Reports - PLEASE review detailed and final reports thoroughly  -       ECHOCARDIOGRAM COMPLETE  Result Date: 01/09/2023     ECHOCARDIOGRAM REPORT   Patient Name:   Charles Brock Date of Exam: 01/09/2023 Medical Rec #:  [id number]     Height:       70.0 in Accession #:    [id number]    Weight:  236.8 lb Date of Birth:  Jun 25, 1991    BSA:          2.243 m Patient Age:    31 years      BP:           106/66 mmHg Patient Gender: M             HR:           78 bpm. Exam Location:  Inpatient Procedure: 2D Echo, Cardiac Doppler, Color Doppler and Strain Analysis Indications:    Y19.50 Acute diastolic (congestive) heart failure  History:        Patient has prior history of Echocardiogram examinations.  Sonographer:    Phineas Douglas Referring Phys: [place]  1. Left ventricular ejection fraction, by estimation, is 60 to 65%. The left ventricle has normal function. The left ventricle has no regional wall motion abnormalities. There is mild left ventricular hypertrophy. Left ventricular diastolic parameters were normal.  2. Right ventricular systolic function is normal. The right ventricular size is normal. There is normal pulmonary artery systolic pressure. The estimated right ventricular systolic pressure is 93.2 mmHg.  3. The mitral valve is normal in structure. No evidence of mitral valve regurgitation. No evidence of mitral stenosis.  4. The aortic valve is tricuspid. Aortic valve regurgitation is not visualized. No aortic stenosis is present.  5. The inferior vena cava is normal in size with greater than 50% respiratory variability, suggesting right atrial pressure of 3 mmHg. FINDINGS  Left Ventricle: Left ventricular ejection fraction, by estimation, is 60 to 65%. The left ventricle has normal function. The left ventricle has no regional wall motion abnormalities. The left ventricular internal cavity size was normal in size. There is  mild left ventricular hypertrophy. Left ventricular diastolic parameters were normal. Right Ventricle: The right ventricular size is normal. No increase in right ventricular wall  thickness. Right ventricular systolic function is normal. There is normal pulmonary artery systolic pressure. The tricuspid regurgitant velocity is 2.19 m/s, and  with an assumed right atrial pressure of 3 mmHg, the estimated right ventricular systolic pressure is 67.1 mmHg. Left Atrium: Left atrial size was normal in size. Right Atrium: Right atrial size was normal in size. Pericardium: There is no evidence of pericardial effusion. Mitral Valve: The mitral valve is normal in structure. No evidence of mitral valve regurgitation. No evidence of mitral valve stenosis. Tricuspid Valve: The tricuspid valve is normal in structure. Tricuspid valve regurgitation is trivial. Aortic Valve: The aortic valve is tricuspid. Aortic valve regurgitation is not visualized. No aortic stenosis is present. Pulmonic Valve: The pulmonic valve was not well visualized. Pulmonic valve regurgitation is trivial. Aorta: The aortic root and ascending aorta are structurally normal, with no evidence of dilitation. Venous: The inferior vena cava is normal in size with greater than 50% respiratory variability, suggesting right atrial pressure of 3 mmHg. IAS/Shunts: The interatrial septum was not well visualized.  LEFT VENTRICLE PLAX 2D LVIDd:         4.60 cm      Diastology LVIDs:         2.70 cm      LV e' medial:    11.90 cm/s LV PW:         1.20 cm      LV E/e' medial:  6.1 LV IVS:        1.20 cm      LV e' lateral:   13.80 cm/s LVOT diam:  2.30 cm      LV E/e' lateral: 5.2 LV SV:         94 LV SV Index:   42 LVOT Area:     4.15 cm  LV Volumes (MOD) LV vol d, MOD A2C: 139.0 ml LV vol d, MOD A4C: 135.0 ml LV vol s, MOD A2C: 40.1 ml LV vol s, MOD A4C: 49.0 ml LV SV MOD A2C:     98.9 ml LV SV MOD A4C:     135.0 ml LV SV MOD BP:      95.6 ml RIGHT VENTRICLE             IVC RV Basal diam:  3.90 cm     IVC diam: 1.30 cm RV S prime:     12.20 cm/s TAPSE (M-mode): 1.7 cm LEFT ATRIUM             Index        RIGHT ATRIUM           Index LA diam:         2.80 cm 1.25 cm/m   RA Area:     16.50 cm LA Vol (A2C):   47.9 ml 21.36 ml/m  RA Volume:   40.70 ml  18.15 ml/m LA Vol (A4C):   35.4 ml 15.79 ml/m LA Biplane Vol: 42.9 ml 19.13 ml/m  AORTIC VALVE             PULMONIC VALVE LVOT Vmax:   124.00 cm/s PR End Diast Vel: 3.72 msec LVOT Vmean:  74.800 cm/s LVOT VTI:    0.226 m  AORTA Ao Root diam: 3.10 cm Ao Asc diam:  2.60 cm MITRAL VALVE               TRICUSPID VALVE MV Area (PHT): 2.55 cm    TR Peak grad:   19.2 mmHg MV Decel Time: 298 msec    TR Vmax:        219.00 cm/s MV E velocity: 72.00 cm/s MV A velocity: 51.40 cm/s  SHUNTS MV E/A ratio:  1.40        Systemic VTI:  0.23 m                            Systemic Diam: 2.30 cm Oswaldo Milian MD Electronically signed by Oswaldo Milian MD Signature Date/Time: 01/09/2023/10:16:47 AM    Final    US RENAL  Result Date: 01/09/2023 CLINICAL DATA:  Acute kidney injury EXAM: RENAL / URINARY TRACT ULTRASOUND COMPLETE COMPARISON:  None Available. FINDINGS: Right Kidney: Renal measurements: 10.8 x 4.4 x 5.1 cm = volume: 124.24 mL. Echogenicity within normal limits. No mass or hydronephrosis visualized. Left Kidney: Renal measurements: 12.6 x 4.5 x 4.7 cm = volume: 139.4 mL. Echogenicity within normal limits. No mass or hydronephrosis visualized. Bladder: Appears normal for degree of bladder distention. Other: None. IMPRESSION: Normal renal ultrasound. Electronically Signed   By: Maurine Simmering M.D.   On: 01/09/2023 08:31   CT Angio Chest PE W and/or Wo Contrast  Result Date: 01/08/2023 CLINICAL DATA:  Pulmonary embolism (PE) suspected, high prob. Shortness of breath EXAM: CT ANGIOGRAPHY CHEST WITH CONTRAST TECHNIQUE: Multidetector CT imaging of the chest was performed using the standard protocol during bolus administration of intravenous contrast. Multiplanar CT image reconstructions and MIPs were obtained to evaluate the vascular anatomy. RADIATION DOSE REDUCTION: This exam was performed according to the  departmental dose-optimization program which includes automated  exposure control, adjustment of the mA and/or kV according to patient size and/or use of iterative reconstruction technique. CONTRAST:  26m OMNIPAQUE IOHEXOL 350 MG/ML SOLN COMPARISON:  None Available. FINDINGS: Cardiovascular: Subsegmental filling defects in the right lower lobe posteriorly compatible with pulmonary emboli. Heart is normal size. Aorta is normal caliber. Mediastinum/Nodes: No mediastinal, hilar, or axillary adenopathy. Trachea and esophagus are unremarkable. Thyroid unremarkable. Lungs/Pleura: No confluent opacities or effusions. Upper Abdomen: No acute findings Musculoskeletal: Mild bilateral gynecomastia. No acute bony abnormality. Review of the MIP images confirms the above findings. IMPRESSION: Small subsegmental pulmonary embolus in the right lower lobe posteriorly. These results were called by telephone at the time of interpretation on 01/08/2023 at 1:14 am to provider [facility], who verbally acknowledged these results. Electronically Signed   By: KRolm BaptiseM.D.   On: 01/08/2023 01:18   DG Chest 2 View  Result Date: 01/08/2023 CLINICAL DATA:  Shortness of breath EXAM: CHEST - 2 VIEW COMPARISON:  12/24/2020 FINDINGS: Low lung volumes, bibasilar atelectasis. Heart is normal size. Mediastinal contours within normal limits. No effusions or acute bony abnormality. IMPRESSION: Low lung volumes, bibasilar atelectasis. Electronically Signed   By: KRolm BaptiseM.D.   On: 01/08/2023 00:03   VAS [place]OWER EXTREMITY VENOUS (DVT)  Result Date: 01/02/2023  Lower Venous DVT Study Patient Name:  EELIAKIM Brock Date of Exam:   01/02/2023 Medical Rec #: [id number]     Accession #:    [id number] of Birth: 111-17-1992    Patient Gender: M Patient Age:   89years [facility]      V[facility] [place] EXTREMITY VENOUS (DVT) Referring Phys: CAMILLE ANDY  --------------------------------------------------------------------------------  Indications: Left calf pain, recent Covid-19.  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 Comparison Study: No prior studies. Performing Technologist: RDarlin CocoRDMS, RVT  Examination Guidelines: A complete evaluation includes B-mode imaging, spectral Doppler, color Doppler, and power Doppler as needed of all accessible portions of each vessel. Bilateral testing is considered an integral part of a complete examination. Limited examinations for reoccurring indications may be performed as noted. The reflux portion of the exam is performed with the patient in reverse Trendelenburg.  +-----+---------------+---------+-----------+----------+--------------+ RIGHTCompressibilityPhasicitySpontaneityPropertiesThrombus Aging +-----+---------------+---------+-----------+----------+--------------+ CFV  Full                                                        +-----+---------------+---------+-----------+----------+--------------+   +--------+---------------+---------+-----------+-----------------+-------------+ LEFT    CompressibilityPhasicitySpontaneityProperties       Thrombus                                                                  Aging         +--------+---------------+---------+-----------+-----------------+-------------+ CFV     Full           Yes      Yes                                       +--------+---------------+---------+-----------+-----------------+-------------+ SFJ  Full                                                              +--------+---------------+---------+-----------+-----------------+-------------+ FV Prox Full                                                              +--------+---------------+---------+-----------+-----------------+-------------+ FV Mid  Full                                                               +--------+---------------+---------+-----------+-----------------+-------------+ FV      Full                                                              Distal                                                                    +--------+---------------+---------+-----------+-----------------+-------------+ PFV     Full                                                              +--------+---------------+---------+-----------+-----------------+-------------+ POP     Full           Yes      Yes                                       +--------+---------------+---------+-----------+-----------------+-------------+ PTV     None           No       No         Single paired    Acute                                                    vessel                         +--------+---------------+---------+-----------+-----------------+-------------+ PERO    Full                                                              +--------+---------------+---------+-----------+-----------------+-------------+  Summary: RIGHT: - No evidence of common femoral vein obstruction.  LEFT: - Findings consistent with acute deep vein thrombosis involving the left posterior tibial veins. - No cystic structure found in the popliteal fossa.  *See table(s) above for measurements and observations. Electronically signed by Monica Martinez MD on 01/02/2023 at 4:02:26 PM.    Final     Micro Results     No results found for this or any previous visit (from the past 240 hour(s)).  Today   Subjective    Charles Brock today has no headache,no chest abdominal pain,no new weakness tingling or numbness, feels much better wants to go home today.    Objective   Blood pressure 130/76, pulse 90, temperature 98.2 F (36.8 C), resp. rate 18, height '5\' 10"'$  (1.778 m), weight 107.4 kg, SpO2 98 %.   Intake/Output Summary (Last 24 hours) at 01/09/2023 1026 Last data filed at 01/09/2023 0800 Gross per  24 hour  Intake 1280 ml  Output --  Net 1280 ml    Exam  Awake Alert, No new F.N deficits,    [place].AT,PERRAL Supple Neck,   Symmetrical Chest wall movement, Good air movement bilaterally, CTAB RRR,No Gallops,   +ve B.Sounds, Abd Soft, Non tender,  No Cyanosis, Clubbing or edema    Data Review   Recent Labs  Lab 01/07/23 0002 01/09/23 0459  WBC 6.9 4.4  HGB 15.1 14.9  HCT 45.1 43.8  PLT 309 261  MCV 84.8 86.2  MCH 28.4 29.3  MCHC 33.5 34.0  RDW 12.7 13.0  LYMPHSABS  --  2.2  MONOABS  --  0.3  EOSABS  --  0.2  BASOSABS  --  0.0    Recent Labs  Lab 01/07/23 0002 01/08/23 1755 01/09/23 0459  NA 138  --  137  K 4.5  --  3.9  CL 103  --  103  CO2 25  --  26  ANIONGAP 10  --  8  GLUCOSE 104*  --  102*  BUN 21*  --  11  CREATININE 1.28*  --  1.29*  CRP  --  0.7 <0.5  DDIMER  --  0.64* 0.61*  BNP  --  13.0 17.0  MG  --   --  2.0  CALCIUM 9.5  --  9.3     Total Time in preparing paper work, data evaluation and todays exam - 35 minutes  Signature  -    Lala Lund M.D on 01/09/2023 at 10:26 AM   -  To page go to [email or web address]

## 2023-01-12 ENCOUNTER — Telehealth: Payer: Self-pay | Admitting: *Deleted

## 2023-01-12 NOTE — Telephone Encounter (Signed)
Transition Care Management Follow-up Telephone Call Date of discharge and from where: 01/09/23 from [facility] How have you been since you were released from the hospital? Feeling better Any questions or concerns? No  Items Reviewed: Did the pt receive and understand the discharge instructions provided? Yes  Medications obtained and verified? Yes  Other? Yes  Any new allergies since your discharge? No  Dietary orders reviewed? Yes Do you have support at home? No   Home Care and Equipment/Supplies: Were home health services ordered? no If so, what is the name of the agency? N/A  Has the agency set up a time to come to the patient's home? not applicable Were any new equipment or medical supplies ordered?  No What is the name of the medical supply agency? N/A Were you able to get the supplies/equipment? not applicable Do you have any questions related to the use of the equipment or supplies? No  Functional Questionnaire: (I = Independent and D = Dependent) ADLs: I  Bathing/Dressing- I  Meal Prep- I  Eating- I  Maintaining continence- I  Transferring/Ambulation- I  Managing Meds- I  Follow up appointments reviewed:  PCP Hospital f/u appt confirmed? Yes  Scheduled to see Inda Coke, PA on 01/13/2023 @ 11:20 [place] [facility] f/u appt confirmed? No   Are transportation arrangements needed? No  If their condition worsens, is the pt aware to call PCP or go to the Emergency Dept.? Yes Was the patient provided with contact information for the PCP's office or ED? Yes Was to pt encouraged to call back with questions or concerns? Yes

## 2023-01-13 ENCOUNTER — Ambulatory Visit: Payer: BC Managed Care – PPO | Admitting: Physician Assistant

## 2023-01-13 ENCOUNTER — Encounter: Payer: Self-pay | Admitting: Physician Assistant

## 2023-01-13 VITALS — BP 122/80 | HR 59 | Temp 97.5°F | Ht 70.0 in | Wt 238.0 lb

## 2023-01-13 DIAGNOSIS — I82442 Acute embolism and thrombosis of left tibial vein: Secondary | ICD-10-CM | POA: Diagnosis not present

## 2023-01-13 DIAGNOSIS — I2699 Other pulmonary embolism without acute cor pulmonale: Secondary | ICD-10-CM

## 2023-01-13 NOTE — Progress Notes (Signed)
Charles Brock is a 32 y.o. male here for a follow up of a pre-existing problem.  History of Present Illness:   Chief Complaint  Patient presents with   Hospitalization Follow-up    Pt is here for f/u was Admitted to the hospital on 1/17 for SOB and discharged on 01/09/2023.    HPI  Patient was seen in our office on 01/02/2023 by my colleague Dr. Billey Chang for possible DVT.  He had an ultrasound that confirmed DVT.  He was started on Eliquis starter pack he took this as prescribed.  He then contacted our office 01/06/2023 and was having shortness of breath.  He then went to the ER.  A CTA was done which revealed small PE.  He was admitted for observation.  The concern was that he possibly failed Eliquis however after discussing his history, he reports that shortness of breath and chest pain actually started before calf pain.  It was presumed that he did not fail Eliquis and that he likely had PE at the time of DVT.  He is taking Eliquis as prescribed.  He denies any further chest pain or shortness of breath.    Past Medical History:  Diagnosis Date   History of chicken pox    Migraines      Social History   Tobacco Use   Smoking status: Never   Smokeless tobacco: Never  Substance Use Topics   Alcohol use: No   Drug use: No    History reviewed. No pertinent surgical history.  Family History  Problem Relation Age of Onset   Hypertension Mother    Diabetes Mother    Hypertension Father    Diabetes Paternal Grandmother    Diabetes Paternal Grandfather     No Known Allergies  Current Medications:   Current Outpatient Medications:    apixaban (ELIQUIS) 5 MG TABS tablet, Take 1 tablet (5 mg total) by mouth 2 (two) times daily. Start taking after completion of starter pack., Disp: 60 tablet, Rfl: 1   Review of Systems:   ROS Negative unless otherwise specified per HPI.   Vitals:   Vitals:   01/13/23 1135  BP: 122/80  Pulse: (!) 59  Temp: (!) 97.5 F (36.4 C)   TempSrc: Temporal  SpO2: 99%  Weight: 238 lb (108 kg)  Height: '5\' 10"'$  (1.778 m)     Body mass index is 34.15 kg/m.  Physical Exam:   Physical Exam Vitals and nursing note reviewed.  Constitutional:      General: He is not in acute distress.    Appearance: He is well-developed. He is not ill-appearing or toxic-appearing.  Cardiovascular:     Rate and Rhythm: Normal rate and regular rhythm.     Pulses: Normal pulses.     Heart sounds: Normal heart sounds, S1 normal and S2 normal.  Pulmonary:     Effort: Pulmonary effort is normal.     Breath sounds: Normal breath sounds.  Musculoskeletal:     Comments: No calf tenderness or edema bilaterally  Skin:    General: Skin is warm and dry.  Neurological:     Mental Status: He is alert.     GCS: GCS eye subscore is 4. GCS verbal subscore is 5. GCS motor subscore is 6.  Psychiatric:        Speech: Speech normal.        Behavior: Behavior normal. Behavior is cooperative.     Assessment and Plan:   Acute deep vein thrombosis (DVT)  of tibial vein of left lower extremity ([place]); Acute pulmonary embolism without acute cor pulmonale, unspecified pulmonary embolism type [facility]) Patient is doing well since discharge and is stable Discussed completing Eliquis for a minimum of 3 months He would like a note today to get him out of traveling any far destinations over the next 3 to 6 months due to this medical issue, I have completed a note to request this for his work He would like to follow-up with me in 3 months when stopping blood thinner to check in Follow-up sooner if concerns   Time spent with patient today was 30 minutes which consisted of chart review, discussing diagnosis, work up, treatment answering questions and documentation.   Inda Coke, PA-C

## 2023-01-13 NOTE — Patient Instructions (Signed)
It was great to see you!  Continue eliquis -- if this is unaffordable, please tell me. Do not miss any doses.  Follow-up in 3 months and we will talk about next steps.  Take care,  Inda Coke PA-C

## 2023-01-16 ENCOUNTER — Ambulatory Visit: Payer: BC Managed Care – PPO | Admitting: Family Medicine

## 2023-01-21 ENCOUNTER — Encounter: Payer: Self-pay | Admitting: Physician Assistant

## 2023-01-21 ENCOUNTER — Ambulatory Visit: Payer: BC Managed Care – PPO | Admitting: Physician Assistant

## 2023-01-21 VITALS — BP 130/70 | HR 93 | Temp 97.5°F | Ht 70.0 in | Wt 240.2 lb

## 2023-01-21 DIAGNOSIS — J101 Influenza due to other identified influenza virus with other respiratory manifestations: Secondary | ICD-10-CM

## 2023-01-21 LAB — POC INFLUENZA A&B (BINAX/QUICKVUE)
Influenza A, POC: POSITIVE — AB
Influenza B, POC: NEGATIVE

## 2023-01-21 MED ORDER — OSELTAMIVIR PHOSPHATE 75 MG PO CAPS: 75.0000 mg | ORAL_CAPSULE | Freq: Two times a day (BID) | ORAL | 0 refills | Status: DC

## 2023-01-21 NOTE — Progress Notes (Signed)
Charles Brock is a 32 y.o. male here for a sick visit.  History of Present Illness:   Chief Complaint  Patient presents with   Flu like symptoms    Pt c/o cough since Sunday, expectorating yellow sputum, headache, fever and chills but has not taken temp. Home COVID test yesterday Negative. Using Nyquil & Tylenol    HPI  URI He started coughing with yellow sputum on 01/18/23 and symptoms now include a feeling of being overheated, loss of smell, congestion, headache. Reports diminished appetite but adequate fluid intake. He has been taking Tylenol at home without relief.  Denies: chest pain, SOB, n/v/d   Past Medical History:  Diagnosis Date   History of chicken pox    Migraines      Social History   Tobacco Use   Smoking status: Never   Smokeless tobacco: Never  Substance Use Topics   Alcohol use: No   Drug use: No    History reviewed. No pertinent surgical history.  Family History  Problem Relation Age of Onset   Hypertension Mother    Diabetes Mother    Hypertension Father    Diabetes Paternal Grandmother    Diabetes Paternal Grandfather     No Known Allergies  Current Medications:   Current Outpatient Medications:    apixaban (ELIQUIS) 5 MG TABS tablet, Take 1 tablet (5 mg total) by mouth 2 (two) times daily. Start taking after completion of starter pack., Disp: 60 tablet, Rfl: 1   oseltamivir (TAMIFLU) 75 MG capsule, Take 1 capsule (75 mg total) by mouth 2 (two) times daily., Disp: 10 capsule, Rfl: 0   Review of Systems:   Review of Systems  Constitutional:  Negative for fever and malaise/fatigue.  HENT:  Positive for congestion (Nasal).   Eyes:  Negative for blurred vision.  Respiratory:  Positive for cough and sputum production (Yellow). Negative for shortness of breath.   Cardiovascular:  Negative for chest pain, palpitations and leg swelling.  Gastrointestinal:  Negative for vomiting.  Musculoskeletal:  Negative for back pain.  Skin:  Negative  for rash.  Neurological:  Positive for headaches. Negative for loss of consciousness.    Vitals:   Vitals:   01/21/23 1429  BP: 130/70  Pulse: 93  Temp: (!) 97.5 F (36.4 C)  TempSrc: Temporal  SpO2: 99%  Weight: 240 lb 4 oz (109 kg)  Height: '5\' 10"'$  (1.778 m)     Body mass index is 34.47 kg/m.  Physical Exam:   Physical Exam Vitals and nursing note reviewed.  Constitutional:      General: He is not in acute distress.    Appearance: He is well-developed. He is not ill-appearing or toxic-appearing.  HENT:     Head: Normocephalic and atraumatic.     Right Ear: Tympanic membrane, ear canal and external ear normal. Tympanic membrane is not erythematous, retracted or bulging.     Left Ear: Tympanic membrane, ear canal and external ear normal. Tympanic membrane is not erythematous, retracted or bulging.     Nose: Nose normal.     Right Sinus: No maxillary sinus tenderness or frontal sinus tenderness.     Left Sinus: No maxillary sinus tenderness or frontal sinus tenderness.     Mouth/Throat:     Pharynx: Uvula midline. No posterior oropharyngeal erythema.  Eyes:     General: Lids are normal.     Conjunctiva/sclera: Conjunctivae normal.  Neck:     Trachea: Trachea normal.  Cardiovascular:     Rate  and Rhythm: Normal rate and regular rhythm.     Heart sounds: Normal heart sounds, S1 normal and S2 normal.  Pulmonary:     Effort: Pulmonary effort is normal.     Breath sounds: Normal breath sounds. No decreased breath sounds, wheezing, rhonchi or rales.  Lymphadenopathy:     Cervical: No cervical adenopathy.  Skin:    General: Skin is warm and dry.  Neurological:     Mental Status: He is alert.  Psychiatric:        Speech: Speech normal.        Behavior: Behavior normal. Behavior is cooperative.    Results for orders placed or performed in visit on 01/21/23  POC Influenza A&B(BINAX/QUICKVUE)  Result Value Ref Range   Influenza A, POC Positive (A) Negative   Influenza  B, POC Negative Negative    Assessment and Plan:   Flu-like symptoms Flu test positive Start tamiflu as prescribed Follow-up if new/worsening sx May take tylenol Work note provided No red flags on exam  I,Alexander Ruley,acting as a Education administrator for [employer], PA.,have documented all relevant documentation on the behalf of Inda Coke, PA,as directed by  Inda Coke, PA while in the presence of Inda Coke, [place].  I, Inda Coke, [place], have reviewed all documentation for this visit. The documentation on 01/21/23 for the exam, diagnosis, procedures, and orders are all accurate and complete.   Inda Coke, PA-C

## 2023-01-21 NOTE — Telephone Encounter (Signed)
Pt seeing Charles Brock  today at 2pm

## 2023-01-21 NOTE — Patient Instructions (Signed)
It was great to see you!  You have Influenza A  Start tamiflu  Push fluids and get plenty of rest. Please return if you are not improving as expected, or if you have high fevers (>101.5) or difficulty swallowing or worsening productive cough.  Call clinic with questions.  I hope you start feeling better soon!

## 2023-01-27 ENCOUNTER — Encounter: Payer: Self-pay | Admitting: Physician Assistant

## 2023-02-03 ENCOUNTER — Telehealth (HOSPITAL_COMMUNITY): Payer: Self-pay | Admitting: Student-PharmD

## 2023-02-03 ENCOUNTER — Other Ambulatory Visit: Payer: Self-pay | Admitting: Physician Assistant

## 2023-02-03 NOTE — Telephone Encounter (Signed)
Pt requesting refill for Ketoconazole cream 2%. Has not been filled by you. Please advise if okay to fill Rx?

## 2023-02-03 NOTE — Telephone Encounter (Signed)
Patient called requesting refills for Eliquis. Informed patient that [facility], but he may have to request them to fill it for him. Provided the pharmacy phone number to the patient. He will call back if there are any problems filling the prescription.

## 2023-02-06 ENCOUNTER — Ambulatory Visit (HOSPITAL_COMMUNITY)
Admission: RE | Admit: 2023-02-06 | Discharge: 2023-02-06 | Disposition: A | Discharge status: Home / Self Care | Payer: BC Managed Care – PPO | Source: Ambulatory Visit | Attending: Physician Assistant | Admitting: Physician Assistant

## 2023-02-06 VITALS — BP 118/83 | HR 58

## 2023-02-06 DIAGNOSIS — I2699 Other pulmonary embolism without acute cor pulmonale: Secondary | ICD-10-CM | POA: Insufficient documentation

## 2023-02-06 DIAGNOSIS — Z7901 Long term (current) use of anticoagulants: Secondary | ICD-10-CM | POA: Diagnosis not present

## 2023-02-06 DIAGNOSIS — I82442 Acute embolism and thrombosis of left tibial vein: Secondary | ICD-10-CM | POA: Diagnosis not present

## 2023-02-06 DIAGNOSIS — Z8616 Personal history of COVID-19: Secondary | ICD-10-CM | POA: Diagnosis not present

## 2023-02-06 NOTE — Progress Notes (Signed)
DVT Clinic Note  Name: Charles Brock     MRN: [id number]     DOB: 1991/07/02     Sex: male  PCP: Inda Coke, PA  Today's Visit: Visit Information: Follow Up Visit  Referred to DVT Clinic by: Dr. Jonni Sanger ([place])   Referred to CPP by: Dr. Scot Dock Reason for referral:  Chief Complaint  Patient presents with   Med Management - DVT   HISTORY OF PRESENT ILLNESS:  Charles Brock is a 32 y.o. male who presents for follow up medication management after diagnosis of DVT involving the left posterior tibial veins on 01/02/23 2/2 recent COVID infection and prolonged car travel shortly before symptom onset. Last seen in DVT Clinic 01/02/23 at which time Eliquis was started. On 01/07/23, the patient presented to the ED for SOB and was admitted for observation 1/18-1/19 after small subsegmental PE at the right lower lob was found on CT. Eliquis was continued throughout admission and at discharge as PE was felt not to be an Eliquis failure.  Today patient reports he is doing much better compared to his last visit. Does still have some mild pain and tingling when he stands or walks for a long period of time but that this is improving. No longer having any shortness of breath. Denies abnormal bleeding or bruising. Denies any missed doses of Eliquis. Endorses wearing compression stockings with no further swelling issues. Since his last visit he had the flu but has recovered well.   Positive Thrombotic Risk Factors: Recent COVID diagnosis (within 3 months), Sedentary journey lasting >8 hours within 4 weeks, Bed rest >72 hours within 3 month Bleeding Risk Factors: Anticoagulant therapy  Negative Thrombotic Risk Factors: Recent surgery (within 3 months), Recent trauma (within 3 months), Recent admission to hospital with acute illness (within 3 months), Paralysis, paresis, or recent plaster cast immobilization of lower extremity, Central venous catheterization, Pregnancy, Within 6 weeks  postpartum, Recent cesarean section (within 3 months), Estrogen therapy, Testosterone therapy, Active cancer, Smoking, Obesity, Older age, Known thrombophilic condition, Non-malignant, chronic inflammatory condition, Erythropoiesis-stimulating agent  Rx Insurance Coverage: Commercial Rx Affordability: Eliquis copay is $0.  Preferred Pharmacy: [facility] [address]  Past Medical History:  Diagnosis Date   History of chicken pox    Migraines     No past surgical history on file.  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[employer], no children   Social Determinants of Health   Financial Resource Strain: Not on file  Food Insecurity: Not on file  Transportation Needs: Not on file  Physical Activity: Not on file  Stress: Not on file  Social Connections: Not on file  Intimate Partner Violence: Not on file    Family History  Problem Relation Age of Onset   Hypertension Mother    Diabetes Mother  Hypertension Father    Diabetes Paternal Grandmother    Diabetes Paternal Grandfather     Allergies as of 02/06/2023   (No Known Allergies)    Current Outpatient Medications on File Prior to Encounter  Medication Sig Dispense Refill   apixaban (ELIQUIS) 5 MG TABS tablet Take 1 tablet (5 mg total) by mouth 2 (two) times daily. Start taking after completion of starter pack. 60 tablet 1   ketoconazole (NIZORAL) 2 % cream APPLY TO 1-2 TIMES DAILY (Patient not taking:  Reported on 02/06/2023) 15 g 0   No current facility-administered medications on file prior to encounter.   REVIEW OF SYSTEMS:  Review of Systems  Respiratory:  Negative for shortness of breath.   Cardiovascular:  Negative for chest pain, palpitations and leg swelling.  Musculoskeletal:  Negative for myalgias.  Neurological:  Negative for dizziness and tingling.   PHYSICAL EXAMINATION:  Vitals:   02/06/23 0905  BP: 118/83  Pulse: (!) 58  SpO2: 99%    There is no height or weight on file to calculate BMI.  Physical Exam Vitals reviewed.  Cardiovascular:     Rate and Rhythm: Normal rate.  Pulmonary:     Effort: Pulmonary effort is normal.  Musculoskeletal:     Right lower leg: No edema.     Left lower leg: No edema.  Psychiatric:        Mood and Affect: Mood normal.        Behavior: Behavior normal.        Thought Content: Thought content normal.   Villalta Score for Post-Thrombotic Syndrome: Pain: Mild Cramps: Absent Heaviness: Absent Paresthesia: Mild Pruritus: Absent Pretibial Edema: Absent Skin Induration: Absent Hyperpigmentation: Absent Redness: Absent Venous Ectasia: Absent Pain on calf compression: Absent Villalta Preliminary Score: 2 Is venous ulcer present?: No If venous ulcer is present and score is <15, then 15 points total are assigned: Absent Villalta Total Score: 2  LABS:  CBC     Component Value Date/Time   WBC 4.4 01/09/2023 0459   RBC 5.08 01/09/2023 0459   HGB 14.9 01/09/2023 0459   HCT 43.8 01/09/2023 0459   PLT 261 01/09/2023 0459   MCV 86.2 01/09/2023 0459   MCH 29.3 01/09/2023 0459   MCHC 34.0 01/09/2023 0459   RDW 13.0 01/09/2023 0459   LYMPHSABS 2.2 01/09/2023 0459   MONOABS 0.3 01/09/2023 0459   EOSABS 0.2 01/09/2023 0459   BASOSABS 0.0 01/09/2023 0459    Hepatic Function      Component Value Date/Time   PROT 7.9 10/08/2022 1616   ALBUMIN 4.6 10/08/2022 1616   AST 22 10/08/2022 1616   ALT 28 10/08/2022 1616   ALKPHOS  66 10/08/2022 1616   BILITOT 1.1 10/08/2022 1616    Renal Function   Lab Results  Component Value Date   CREATININE 1.29 (H) 01/09/2023   CREATININE 1.28 (H) 01/07/2023   CREATININE 1.33 10/08/2022    CrCl cannot be calculated (Patient's most recent lab result is older than the maximum 21 days allowed.).   Imaging Studies:  01/02/23 VAS [place] LOWER EXTREMITY VENOUS LEFT (DVT)  Summary:  RIGHT:  - No evidence of common femoral vein obstruction.   LEFT:  - Findings consistent with acute deep vein thrombosis involving the left  posterior tibial veins.  - No cystic structure found in the popliteal fossa.   01/08/23 CT Angio Chest PE W and/or Wo Contrast IMPRESSION: Small subsegmental pulmonary embolus in the right lower lobe posteriorly.  ASSESSMENT: Location of  DVT: Left distal vein Cause of DVT: provoked by a transient risk factor   Recommend 3-6 months of treatment for distal DVT + small subsegmental PE. Patient prefers 6 months. He plans to discuss with his PCP at his next follow up.   PLAN: -Continue apixaban (Eliquis) 5 mg twice daily. -Expected duration of therapy: 3-6 months. Therapy started on 01/02/23. -Patient educated on purpose, proper use and potential adverse effects of apixaban (Eliquis). -Discussed importance of taking medication around the same time every day. -Advised patient of medications to avoid (NSAIDs, aspirin doses >100 mg daily). -Educated that Tylenol (acetaminophen) is the preferred analgesic to lower the risk of bleeding. -Advised patient to alert all providers of anticoagulation therapy prior to starting a new medication or having a procedure. -Emphasized importance of monitoring for signs and symptoms of bleeding (abnormal bruising, prolonged bleeding, nose bleeds, bleeding from gums, discolored urine, black tarry stools). -Educated patient to present to the ED if emergent signs and symptoms of new thrombosis occur. -Counseled patient to wear  compression stockings daily, removing at night.  Follow up: with PCP as planned after 3 months of anticoagulation and with DVT Clinic as needed.  Rebbeca Paul, PharmD, Para [place], CPP [facility]romb[facility] Clinical Pharmacist Practitioner Office: [phone]

## 2023-02-06 NOTE — Patient Instructions (Signed)
-  Continue Eliquis 5 mg (1 tablet) twice daily.  -[facility] You will likely need to call the pharmacy to ask them to fill this when you start to run low on your current supply.  -It is important to take your medication around the same time every day.  -Avoid NSAIDs like ibuprofen (Advil, Motrin) and naproxen (Aleve) as well as aspirin doses over 100 mg daily. -Tylenol (acetaminophen) is the preferred over the counter pain medication to lower the risk of bleeding. -Be sure to alert all of your health care providers that you are taking an anticoagulant prior to starting a new medication or having a procedure. -Monitor for signs and symptoms of bleeding (abnormal bruising, prolonged bleeding, nose bleeds, bleeding from gums, discolored urine, black tarry stools). If you have fallen and hit your head OR if your bleeding is severe or not stopping, seek emergency care.  -Go to the emergency room if emergent signs and symptoms of new clot occur (new or worse swelling and pain in an arm or leg, shortness of breath, chest pain, fast or irregular heartbeats, lightheadedness, dizziness, fainting, coughing up blood) or if you experience a significant color change (pale or blue) in the extremity that has the DVT.  -We recommend you wear knee high compression stockings as long as you are having swelling or pain. Be sure to purchase the correct size and take them off at night.   If you have any questions, please call [phone] [facility].  If you are having an emergency, call 911 or present to the nearest emergency room.   What is a DVT?  -Deep vein thrombosis (DVT) is a condition in which a blood clot forms in a vein of the deep venous system which can occur in the lower leg, thigh, pelvis, arm, or neck. This condition is serious and can be life-threatening if the clot travels to the arteries of the lungs and causing a blockage (pulmonary embolism, PE). A DVT can also damage veins in  the leg, which can lead to long-term venous disease, leg pain, swelling, discoloration, and ulcers or sores (post-thrombotic syndrome).  -Treatment may include taking an anticoagulant medication to prevent more clots from forming and the current clot from growing, wearing compression stockings, and/or surgical procedures to remove or dissolve the clot.

## 2023-03-18 ENCOUNTER — Encounter: Payer: Self-pay | Admitting: Physician Assistant

## 2023-04-04 ENCOUNTER — Encounter: Payer: Self-pay | Admitting: Physician Assistant

## 2023-04-06 MED ORDER — APIXABAN 5 MG PO TABS: 5.0000 mg | ORAL_TABLET | Freq: Two times a day (BID) | ORAL | 4 refills | Status: DC

## 2023-04-06 NOTE — Telephone Encounter (Signed)
Okay to order Eliquis for pt?

## 2023-04-25 ENCOUNTER — Encounter: Payer: Self-pay | Admitting: Physician Assistant

## 2023-04-27 ENCOUNTER — Telehealth: Payer: Self-pay | Admitting: Oncology

## 2023-04-27 ENCOUNTER — Other Ambulatory Visit: Payer: Self-pay | Admitting: Physician Assistant

## 2023-04-27 DIAGNOSIS — I82442 Acute embolism and thrombosis of left tibial vein: Secondary | ICD-10-CM

## 2023-04-27 DIAGNOSIS — I2699 Other pulmonary embolism without acute cor pulmonale: Secondary | ICD-10-CM

## 2023-04-27 NOTE — Telephone Encounter (Signed)
Please see message and advise 

## 2023-04-27 NOTE — Telephone Encounter (Signed)
scheduled per 5/6 referral , pt has been called and confirmed date and time. Pt is aware of location and to arrive early for check in

## 2023-04-28 NOTE — Telephone Encounter (Signed)
Spoke to pt, told him in the referral it says someone spoke to you about appt and time. Pt said yes, but it was sent to [facility] not to where I requested [facility]. Told him I am sorry Charles Brock did put it in the order. I will have our referral coordinator send to where requested. Pt verbalized understanding.

## 2023-05-08 NOTE — Telephone Encounter (Signed)
Charles Brock, pt still has not heard from [facility] regarding referral sent on 5/6. I sent Misty Stanley message on 5/7 cause it was sent to the wrong place. The information where to send is in the referral.

## 2023-05-08 NOTE — Telephone Encounter (Signed)
Charles Brock has sent referral over to [facility] and has sent patient a Medical laboratory scientific officer. I have also spoken with patient to notify.

## 2023-05-28 NOTE — Progress Notes (Deleted)
[facility] Initial Visit:  Patient Care Team: Jarold Motto, [place] as PCP - General (Physician Assistant)  CHIEF COMPLAINTS/PURPOSE OF CONSULTATION:  Oncology History   No history exists.    HISTORY OF PRESENTING ILLNESS: Charles Brock 32 y.o. male is here because of VTE  January 02, 2023: Diagnosed with distal left lower extremity DVT.  History notable for recent car travel and COVID infection January 08, 2023: Presented to emergency room with shortness of breath.  Had recently been diagnosed with left lower extremity DVT for which she had been taking Eliquis for the past 5 or 6 days small subsegmental pulmonary embolism in right lower lobe posteriorly January 09, 2023: Renal ultrasound normal  Social patient originally from [place]  Review of Systems - Oncology  MEDICAL HISTORY: Past Medical History:  Diagnosis Date   History of chicken pox    Migraines     SURGICAL HISTORY: No past surgical history on file.  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  MEDICATIONS:  Current Outpatient Medications  Medication Sig Dispense Refill   apixaban (ELIQUIS) 5 MG TABS tablet Take 1 tablet (5 mg total) by mouth 2 (two) times daily. 60 tablet 4   ketoconazole (NIZORAL) 2 % cream APPLY TO 1-2 TIMES DAILY (Patient not taking: Reported on 02/06/2023) 15 g 0   No current facility-administered medications for this visit.    PHYSICAL EXAMINATION:  ECOG PERFORMANCE STATUS: {CHL ONC ECOG [phone]}   There were no vitals filed for this visit.  There were no vitals filed for this visit.   Physical Exam   LABORATORY DATA: I have personally reviewed the data as listed:  No visits with results within 1 Month(s) from this visit.  Latest known visit with results is:  Office Visit on 01/21/2023  Component Date Value Ref Range Status   Influenza A, POC 01/21/2023 Positive (A)  Negative Final   Influenza B, POC 01/21/2023 Negative  Negative Final    RADIOGRAPHIC STUDIES: I have personally reviewed the radiological images as listed and agree with the findings in the report  No results found.  ASSESSMENT/PLAN Cancer Staging  No matching staging information was found for the patient.   No problem-specific Assessment & Plan notes found for this encounter.  No orders of the defined types were placed in this encounter.     minutes was spent in patient care.  This included time spent preparing to see the patient (e.g., review of tests), obtaining and/or reviewing separately obtained history, counseling and educating the patient/family/caregiver, ordering medications, tests, or procedures; documenting clinical information in the electronic or other health record, independently interpreting results and communicating results to the patient/family/caregiver as well as coordination of care.       All questions were  answered. The patient knows to call the clinic with any problems, questions or concerns.  This note was electronically signed.    Loni Muse, MD  05/28/2023 8:21 AM

## 2023-05-29 ENCOUNTER — Inpatient Hospital Stay: Payer: BC Managed Care – PPO | Attending: Oncology | Admitting: Oncology

## 2023-05-29 ENCOUNTER — Inpatient Hospital Stay: Payer: BC Managed Care – PPO

## 2023-06-03 DIAGNOSIS — I2699 Other pulmonary embolism without acute cor pulmonale: Secondary | ICD-10-CM | POA: Diagnosis not present

## 2023-06-03 DIAGNOSIS — I82442 Acute embolism and thrombosis of left tibial vein: Secondary | ICD-10-CM | POA: Diagnosis not present

## 2023-07-09 DIAGNOSIS — I2699 Other pulmonary embolism without acute cor pulmonale: Secondary | ICD-10-CM | POA: Diagnosis not present

## 2023-07-09 DIAGNOSIS — I517 Cardiomegaly: Secondary | ICD-10-CM | POA: Diagnosis not present

## 2023-07-09 DIAGNOSIS — I82442 Acute embolism and thrombosis of left tibial vein: Secondary | ICD-10-CM | POA: Diagnosis not present

## 2023-07-09 DIAGNOSIS — M7989 Other specified soft tissue disorders: Secondary | ICD-10-CM | POA: Diagnosis not present

## 2023-07-09 DIAGNOSIS — J9811 Atelectasis: Secondary | ICD-10-CM | POA: Diagnosis not present

## 2023-07-15 DIAGNOSIS — I2699 Other pulmonary embolism without acute cor pulmonale: Secondary | ICD-10-CM | POA: Diagnosis not present

## 2023-07-15 DIAGNOSIS — I82442 Acute embolism and thrombosis of left tibial vein: Secondary | ICD-10-CM | POA: Diagnosis not present

## 2023-07-15 DIAGNOSIS — D6859 Other primary thrombophilia: Secondary | ICD-10-CM | POA: Diagnosis not present

## 2023-09-10 DIAGNOSIS — D6859 Other primary thrombophilia: Secondary | ICD-10-CM | POA: Diagnosis not present

## 2023-09-10 DIAGNOSIS — I82442 Acute embolism and thrombosis of left tibial vein: Secondary | ICD-10-CM | POA: Diagnosis not present

## 2023-09-10 DIAGNOSIS — I2699 Other pulmonary embolism without acute cor pulmonale: Secondary | ICD-10-CM | POA: Diagnosis not present

## 2023-09-17 DIAGNOSIS — I82442 Acute embolism and thrombosis of left tibial vein: Secondary | ICD-10-CM | POA: Diagnosis not present

## 2023-09-17 DIAGNOSIS — I2699 Other pulmonary embolism without acute cor pulmonale: Secondary | ICD-10-CM | POA: Diagnosis not present

## 2023-12-22 ENCOUNTER — Encounter: Payer: Self-pay | Admitting: Physician Assistant

## 2023-12-22 DIAGNOSIS — F4323 Adjustment disorder with mixed anxiety and depressed mood: Secondary | ICD-10-CM

## 2023-12-24 ENCOUNTER — Ambulatory Visit: Payer: BC Managed Care – PPO | Admitting: Physician Assistant

## 2023-12-24 ENCOUNTER — Encounter: Payer: Self-pay | Admitting: Physician Assistant

## 2023-12-24 VITALS — BP 120/80 | HR 67 | Temp 97.3°F | Ht 70.0 in | Wt 237.0 lb

## 2023-12-24 DIAGNOSIS — F4323 Adjustment disorder with mixed anxiety and depressed mood: Secondary | ICD-10-CM

## 2023-12-24 NOTE — Progress Notes (Signed)
 Charles Brock is a 33 y.o. male here for a new problem.  History of Present Illness:   Chief Complaint  Patient presents with  . Anxiety/Depression    Pt c/o anxiety and depression has worsened the past 3-4 weeks.    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  He is having sharp chest pain when feeling stressed.  Has difficulty meeting work goals due to severe stress and depression  He has had suicidal thoughts Endorses a plan but does not share this with me today Barriers include family  Participated in talk therapy in the past without significant success No prior depression/anxiety prior to current job    Past Medical History:  Diagnosis Date  . History of chicken pox   . Migraines      Social History   Tobacco Use  . Smoking status: Never  . Smokeless tobacco: Never  Substance Use Topics  . Alcohol use: No  . Drug use: No    No past surgical history on file.  Family History  Problem Relation Age of Onset  . Hypertension Mother   . Diabetes Mother   . Hypertension Father   . Diabetes Paternal Grandmother   . Diabetes Paternal Grandfather     No Known Allergies  Current Medications:  No current outpatient medications on file.   Review of Systems:   ROS Negative unless otherwise specified  per HPI.  Vitals:   Vitals:   12/24/23 1113  BP: 120/80  Pulse: 67  Temp: (!) 97.3 F (36.3 C)  TempSrc: Temporal  SpO2: 98%  Weight: 237 lb (107.5 kg)  Height: 5' 10 (1.778 m)     Body mass index is 34.01 kg/m.  Physical Exam:   Physical Exam Vitals and nursing note reviewed.  Constitutional:      Appearance: He is well-developed.  HENT:     Head: Normocephalic.  Eyes:     Conjunctiva/sclera: Conjunctivae normal.     Pupils: Pupils are equal, round, and reactive to light.  Pulmonary:     Effort: Pulmonary effort is normal.  Musculoskeletal:        General: Normal range of motion.     Cervical back: Normal range of motion.  Skin:    General: Skin is warm and dry.  Neurological:     General: No focal deficit present.     Mental Status: He is alert and oriented to person, place, and time.  Psychiatric:        Attention and Perception: Attention normal.        Mood and Affect: Mood is depressed. Affect is flat.        Behavior: Behavior normal.        Thought Content: Thought content normal.        Judgment: Judgment normal.  Assessment and Plan:   Situational mixed anxiety and depressive disorder Uncontrolled Discussed need for medical leave for mental health -- he is going to pursue this -- recommend 90 days if possible Recommend IOP -- options discussed include [name] and [facility] Discussed medication options Prozac/Fluoxetine or Celexa/Citalopram -- he will look into this and let me know if he would like to start this - risks/benefits/side effect(s) discussed Follow-up in 1 month after leave commences I discussed with patient that if they develop any SI, to tell someone immediately and seek medical attention.  I spent a total of 43 minutes on this visit, today 12/24/23, discussing plan of care with patient and using shared-decision making on next steps, and documenting the findings in the note.  [name], PA-C

## 2023-12-24 NOTE — Patient Instructions (Signed)
 It was great to see you!  We are going to consider medication -- I would like to consider Prozac/Fluoxetine or Celexa/Citalopram  Consider [name] or [facility] (you can find IOP information for [facility] online) -- message me with which one you would like to pursue and I will get referral placed  Send us  paperwork and dates you decide for leave  If you develop suicidal thoughts, please tell someone and immediately proceed to our local 24/7 crisis center, [facility]. [address] [place], [place] [address] [phone].  Let's follow-up in 1 month, sooner if you have concerns.  Take care,  [name] PA-C

## 2023-12-25 NOTE — Telephone Encounter (Signed)
 Charles Brock, received pt's FMLA paperwork would like to be out 01/04/2024 RTW on 04/04/2024. Forms put in your folder.

## 2023-12-30 NOTE — Addendum Note (Signed)
 Addended by: Jimmye Norman on: 12/30/2023 08:15 AM   Modules accepted: Orders

## 2023-12-31 DIAGNOSIS — I2699 Other pulmonary embolism without acute cor pulmonale: Secondary | ICD-10-CM | POA: Diagnosis not present

## 2023-12-31 DIAGNOSIS — I82442 Acute embolism and thrombosis of left tibial vein: Secondary | ICD-10-CM | POA: Diagnosis not present

## 2024-01-11 NOTE — Telephone Encounter (Signed)
FMLA forms placed in your folder. I have completed what I could please review and sign.

## 2024-01-14 ENCOUNTER — Encounter: Payer: BC Managed Care – PPO | Admitting: Physician Assistant

## 2024-01-15 NOTE — Telephone Encounter (Signed)
Spoke to pt asked him are you not taking continuous leave as discussed? Pt said no, due to work will have to stop if he is not there. Pt said he needs intermittent leave so he can attend his therapy sessions. Pt said he goes to therapy on Mon, Tues, and Thursday each week for 3 hours a session. Told pt okay I will correct forms, but will not be done till Monday since Lelon Mast is out of the office. Pt said that is fine. Told him will complete forms and have Samantha sign on Monday and then fax over to [facility] and make a copy for you to pick up. Pt verbalized understanding.

## 2024-01-18 NOTE — Telephone Encounter (Signed)
FMLA forms completed again and signed by [place]. Forms faxed to [facility] at [phone].

## 2024-01-27 NOTE — Progress Notes (Signed)
 Charles Brock is a 33 y.o. male here for a follow up of a pre-existing problem.  History of Present Illness:   Chief Complaint  Patient presents with  . Anxiety/Depression    He is feeling little better, therapy has been helpful.    HPI  Situational mixed and depression disorder: Continues talk therapy with [name] Has found this to be beneficial, specifically the group sessions Continues to work full time Denies suicidal ideation/hi but is having overall difficulty with work stamina due to ongoing mental health issues Hoping to start leave soon   Past Medical History:  Diagnosis Date  . History of chicken pox   . Migraines      Social History   Tobacco Use  . Smoking status: Never  . Smokeless tobacco: Never  Substance Use Topics  . Alcohol use: No  . Drug use: No    No past surgical history on file.  Family History  Problem Relation Age of Onset  . Hypertension Mother   . Diabetes Mother   . Hypertension Father   . Diabetes Paternal Grandmother   . Diabetes Paternal Grandfather     No Known Allergies  Current Medications:  No current outpatient medications on file.   Review of Systems:   Negative unless otherwise specified per HPI.  Vitals:   Vitals:   01/28/24 1031  BP: 120/80  Pulse: 68  Temp: 97.9 F (36.6 C)  TempSrc: Temporal  SpO2: 98%  Weight: 229 lb 8 oz (104.1 kg)  Height: 5' 10 (1.778 m)     Body mass index is 32.93 kg/m.  Physical Exam:   Physical Exam Vitals and nursing note reviewed.  Constitutional:      Appearance: He is well-developed.  HENT:     Head: Normocephalic.  Eyes:     Conjunctiva/sclera: Conjunctivae normal.     Pupils: Pupils are equal, round, and reactive to light.  Pulmonary:     Effort: Pulmonary effort is normal.  Musculoskeletal:        General: Normal range of motion.     Cervical back: Normal range of motion.  Skin:    General: Skin is warm and dry.  Neurological:     Mental Status:  He is alert and oriented to person, place, and time.  Psychiatric:        Behavior: Behavior normal.        Thought Content: Thought content normal.        Judgment: Judgment normal.    Assessment and Plan:   Situational mixed anxiety and depressive disorder (Primary); Adjustment disorder with mixed anxiety and depressed mood Ongoing He is going to continue IOP with [name] Declines medication Consider melatonin for insomnia He will reach out when he is ready to pursue 7-month leave  I, Vernell Forest, acting as a neurosurgeon for Charles Brock, [place]., have documented all relevant documentation on the behalf of Charles Brock, [place], as directed by  Charles Buttner, PA while in the presence of Charles Brock, [place].  I, Charles Brock, [place], have reviewed all documentation for this visit. The documentation on 01/28/24 for the exam, diagnosis, procedures, and orders are all accurate and complete.   Charles Buttner, PA-C

## 2024-01-28 ENCOUNTER — Encounter: Payer: Self-pay | Admitting: Physician Assistant

## 2024-01-28 ENCOUNTER — Ambulatory Visit: Payer: BC Managed Care – PPO | Admitting: Physician Assistant

## 2024-01-28 VITALS — BP 120/80 | HR 68 | Temp 97.9°F | Ht 70.0 in | Wt 229.5 lb

## 2024-01-28 DIAGNOSIS — F4323 Adjustment disorder with mixed anxiety and depressed mood: Secondary | ICD-10-CM | POA: Diagnosis not present
